# Patient Record
Sex: Male | Born: 1975
Health system: Southern US, Community
[De-identification: ages and names within clinical notes are randomized; demographics above are authoritative.]

## PROBLEM LIST (undated history)

## (undated) DIAGNOSIS — E041 Nontoxic single thyroid nodule: Secondary | ICD-10-CM

## (undated) DIAGNOSIS — G43909 Migraine, unspecified, not intractable, without status migrainosus: Secondary | ICD-10-CM

## (undated) DIAGNOSIS — K219 Gastro-esophageal reflux disease without esophagitis: Secondary | ICD-10-CM

## (undated) HISTORY — DX: Gastro-esophageal reflux disease without esophagitis: K21.9

## (undated) HISTORY — DX: Migraine, unspecified, not intractable, without status migrainosus: G43.909

## (undated) HISTORY — DX: Nontoxic single thyroid nodule: E04.1

## (undated) HISTORY — PX: OTHER SURGICAL HISTORY: SHX169

---

## 2001-03-08 HISTORY — PX: OTHER SURGICAL HISTORY: SHX169

## 2009-03-22 ENCOUNTER — Emergency Department (HOSPITAL_COMMUNITY): Admission: EM | Admit: 2009-03-22 | Discharge: 2009-03-22 | Payer: Self-pay | Admitting: Emergency Medicine

## 2011-03-04 ENCOUNTER — Encounter (INDEPENDENT_AMBULATORY_CARE_PROVIDER_SITE_OTHER): Payer: Self-pay

## 2017-05-10 DIAGNOSIS — Z0000 Encounter for general adult medical examination without abnormal findings: Secondary | ICD-10-CM | POA: Diagnosis not present

## 2017-05-17 DIAGNOSIS — G43909 Migraine, unspecified, not intractable, without status migrainosus: Secondary | ICD-10-CM | POA: Diagnosis not present

## 2017-05-17 DIAGNOSIS — Z0000 Encounter for general adult medical examination without abnormal findings: Secondary | ICD-10-CM | POA: Diagnosis not present

## 2017-05-17 DIAGNOSIS — J069 Acute upper respiratory infection, unspecified: Secondary | ICD-10-CM | POA: Diagnosis not present

## 2017-05-17 DIAGNOSIS — Z1389 Encounter for screening for other disorder: Secondary | ICD-10-CM | POA: Diagnosis not present

## 2017-06-02 DIAGNOSIS — H5213 Myopia, bilateral: Secondary | ICD-10-CM | POA: Diagnosis not present

## 2017-06-02 DIAGNOSIS — D3132 Benign neoplasm of left choroid: Secondary | ICD-10-CM | POA: Diagnosis not present

## 2017-06-02 DIAGNOSIS — H52223 Regular astigmatism, bilateral: Secondary | ICD-10-CM | POA: Diagnosis not present

## 2018-05-16 DIAGNOSIS — R82998 Other abnormal findings in urine: Secondary | ICD-10-CM | POA: Diagnosis not present

## 2018-05-16 DIAGNOSIS — Z0000 Encounter for general adult medical examination without abnormal findings: Secondary | ICD-10-CM | POA: Diagnosis not present

## 2018-05-23 DIAGNOSIS — Z0000 Encounter for general adult medical examination without abnormal findings: Secondary | ICD-10-CM | POA: Diagnosis not present

## 2018-07-18 DIAGNOSIS — Z3009 Encounter for other general counseling and advice on contraception: Secondary | ICD-10-CM | POA: Diagnosis not present

## 2018-07-28 DIAGNOSIS — H04123 Dry eye syndrome of bilateral lacrimal glands: Secondary | ICD-10-CM | POA: Diagnosis not present

## 2018-07-28 DIAGNOSIS — H5213 Myopia, bilateral: Secondary | ICD-10-CM | POA: Diagnosis not present

## 2018-07-28 DIAGNOSIS — D3132 Benign neoplasm of left choroid: Secondary | ICD-10-CM | POA: Diagnosis not present

## 2018-10-26 ENCOUNTER — Other Ambulatory Visit: Payer: Self-pay

## 2018-10-26 DIAGNOSIS — Z20822 Contact with and (suspected) exposure to covid-19: Secondary | ICD-10-CM

## 2018-10-27 LAB — NOVEL CORONAVIRUS, NAA: SARS-CoV-2, NAA: NOT DETECTED

## 2018-12-22 ENCOUNTER — Other Ambulatory Visit: Payer: Self-pay

## 2018-12-22 DIAGNOSIS — Z20822 Contact with and (suspected) exposure to covid-19: Secondary | ICD-10-CM

## 2018-12-24 LAB — NOVEL CORONAVIRUS, NAA: SARS-CoV-2, NAA: NOT DETECTED

## 2019-09-27 ENCOUNTER — Ambulatory Visit
Admission: RE | Admit: 2019-09-27 | Discharge: 2019-09-27 | Disposition: A | Discharge status: Home / Self Care | Payer: Self-pay | Source: Ambulatory Visit | Attending: Internal Medicine | Admitting: Internal Medicine

## 2019-09-27 ENCOUNTER — Other Ambulatory Visit: Payer: Self-pay | Admitting: Internal Medicine

## 2019-09-27 DIAGNOSIS — E079 Disorder of thyroid, unspecified: Secondary | ICD-10-CM

## 2019-09-28 ENCOUNTER — Other Ambulatory Visit: Payer: Self-pay | Admitting: Internal Medicine

## 2019-09-28 DIAGNOSIS — E079 Disorder of thyroid, unspecified: Secondary | ICD-10-CM

## 2019-10-02 ENCOUNTER — Ambulatory Visit
Admission: RE | Admit: 2019-10-02 | Discharge: 2019-10-02 | Disposition: A | Discharge status: Home / Self Care | Payer: 59 | Source: Ambulatory Visit | Attending: Internal Medicine | Admitting: Internal Medicine

## 2019-10-02 ENCOUNTER — Other Ambulatory Visit (HOSPITAL_COMMUNITY)
Admission: RE | Admit: 2019-10-02 | Discharge: 2019-10-02 | Disposition: A | Discharge status: Home / Self Care | Payer: 59 | Source: Ambulatory Visit | Attending: Student | Admitting: Student

## 2019-10-02 DIAGNOSIS — E041 Nontoxic single thyroid nodule: Secondary | ICD-10-CM | POA: Diagnosis present

## 2019-10-02 DIAGNOSIS — E079 Disorder of thyroid, unspecified: Secondary | ICD-10-CM

## 2019-10-03 LAB — CYTOLOGY - NON PAP

## 2019-10-09 ENCOUNTER — Other Ambulatory Visit: Payer: 59

## 2020-02-11 ENCOUNTER — Other Ambulatory Visit (HOSPITAL_COMMUNITY): Payer: Self-pay | Admitting: Internal Medicine

## 2020-02-11 DIAGNOSIS — R002 Palpitations: Secondary | ICD-10-CM

## 2020-02-14 ENCOUNTER — Other Ambulatory Visit: Payer: Self-pay

## 2020-02-14 ENCOUNTER — Ambulatory Visit (HOSPITAL_COMMUNITY)
Admission: RE | Admit: 2020-02-14 | Discharge: 2020-02-14 | Disposition: A | Discharge status: Home / Self Care | Payer: 59 | Source: Ambulatory Visit | Attending: Internal Medicine | Admitting: Internal Medicine

## 2020-02-14 DIAGNOSIS — R002 Palpitations: Secondary | ICD-10-CM | POA: Diagnosis present

## 2020-02-14 LAB — ECHOCARDIOGRAM COMPLETE
Area-P 1/2: 3.6 cm2
S' Lateral: 3.1 cm

## 2020-02-14 NOTE — Progress Notes (Signed)
  Echocardiogram 2D Echocardiogram has been performed.  Eric Chaney M 02/14/2020, 2:26 PM

## 2020-03-06 ENCOUNTER — Other Ambulatory Visit (HOSPITAL_COMMUNITY): Payer: 59

## 2020-03-10 ENCOUNTER — Telehealth (HOSPITAL_COMMUNITY): Payer: Self-pay | Admitting: Cardiology

## 2020-03-10 DIAGNOSIS — R002 Palpitations: Secondary | ICD-10-CM

## 2020-03-10 NOTE — Telephone Encounter (Signed)
Per Dr Gala Romney Patient in need of stress test (tredmill only) or preferably CPX and 14 day zio monitor for palps. Patients availability is Friday afternoons.   1/7 @ 11am ok with patient

## 2020-03-14 ENCOUNTER — Encounter (HOSPITAL_COMMUNITY): Payer: 59

## 2020-03-20 ENCOUNTER — Other Ambulatory Visit (HOSPITAL_COMMUNITY): Payer: Self-pay | Admitting: *Deleted

## 2020-03-20 ENCOUNTER — Ambulatory Visit (HOSPITAL_COMMUNITY)
Admission: RE | Admit: 2020-03-20 | Discharge: 2020-03-20 | Disposition: A | Discharge status: Home / Self Care | Payer: 59 | Source: Ambulatory Visit | Attending: Internal Medicine | Admitting: Internal Medicine

## 2020-03-20 ENCOUNTER — Ambulatory Visit (HOSPITAL_COMMUNITY): Payer: 59

## 2020-03-20 ENCOUNTER — Other Ambulatory Visit (HOSPITAL_COMMUNITY): Payer: Self-pay | Admitting: Internal Medicine

## 2020-03-20 ENCOUNTER — Other Ambulatory Visit: Payer: Self-pay

## 2020-03-20 DIAGNOSIS — R002 Palpitations: Secondary | ICD-10-CM

## 2020-07-08 ENCOUNTER — Other Ambulatory Visit (HOSPITAL_COMMUNITY): Payer: Self-pay | Admitting: *Deleted

## 2020-07-10 ENCOUNTER — Ambulatory Visit (HOSPITAL_BASED_OUTPATIENT_CLINIC_OR_DEPARTMENT_OTHER)
Admission: RE | Admit: 2020-07-10 | Discharge: 2020-07-10 | Disposition: A | Discharge status: Home / Self Care | Payer: 59 | Source: Ambulatory Visit | Attending: Cardiology | Admitting: Cardiology

## 2020-07-10 ENCOUNTER — Other Ambulatory Visit: Payer: Self-pay

## 2020-08-21 ENCOUNTER — Other Ambulatory Visit: Payer: Self-pay | Admitting: Surgery

## 2020-08-21 DIAGNOSIS — E041 Nontoxic single thyroid nodule: Secondary | ICD-10-CM

## 2020-08-28 ENCOUNTER — Ambulatory Visit
Admission: RE | Admit: 2020-08-28 | Discharge: 2020-08-28 | Disposition: A | Discharge status: Home / Self Care | Payer: 59 | Source: Ambulatory Visit | Attending: Surgery | Admitting: Surgery

## 2020-08-28 DIAGNOSIS — E041 Nontoxic single thyroid nodule: Secondary | ICD-10-CM

## 2020-09-09 ENCOUNTER — Other Ambulatory Visit: Payer: Self-pay | Admitting: Surgery

## 2020-09-09 DIAGNOSIS — E041 Nontoxic single thyroid nodule: Secondary | ICD-10-CM

## 2020-09-16 DIAGNOSIS — E041 Nontoxic single thyroid nodule: Secondary | ICD-10-CM | POA: Diagnosis not present

## 2020-09-17 ENCOUNTER — Ambulatory Visit
Admission: RE | Admit: 2020-09-17 | Discharge: 2020-09-17 | Disposition: A | Discharge status: Home / Self Care | Payer: 59 | Source: Ambulatory Visit | Attending: Surgery | Admitting: Surgery

## 2020-09-17 ENCOUNTER — Other Ambulatory Visit: Payer: Self-pay | Admitting: Surgery

## 2020-09-17 ENCOUNTER — Other Ambulatory Visit: Payer: Self-pay

## 2020-09-17 DIAGNOSIS — E041 Nontoxic single thyroid nodule: Secondary | ICD-10-CM

## 2020-10-08 DIAGNOSIS — R002 Palpitations: Secondary | ICD-10-CM | POA: Diagnosis not present

## 2020-11-06 DIAGNOSIS — E041 Nontoxic single thyroid nodule: Secondary | ICD-10-CM | POA: Diagnosis not present

## 2020-12-18 ENCOUNTER — Encounter: Payer: Self-pay | Admitting: Cardiology

## 2020-12-18 ENCOUNTER — Ambulatory Visit: Payer: BC Managed Care – PPO | Admitting: Cardiology

## 2020-12-18 ENCOUNTER — Other Ambulatory Visit: Payer: Self-pay

## 2020-12-18 VITALS — BP 118/70 | HR 74 | Ht 69.0 in | Wt 172.0 lb

## 2020-12-18 DIAGNOSIS — I493 Ventricular premature depolarization: Secondary | ICD-10-CM | POA: Diagnosis not present

## 2020-12-18 NOTE — Progress Notes (Signed)
Electrophysiology Office Note   Date:  12/18/2020   ID:  Eric Chaney, DOB 1976/01/10, MRN [id number]  PCP:  Eric Organ., MD  Cardiologist:   Primary Electrophysiologist:  Eric Bade Meredith Leeds, MD    Chief Complaint: palpitations   History of Present Illness: Eric Chaney is a 45 y.o. male who is being seen today for the evaluation of palpitations at the request of Eric Organ., MD. Presenting today for electrophysiology evaluation.  He presents today for work-up of palpitations.  He has a history of PVCs.  He was able to capture them on his cardia mobile which, per his primary physician, were occurring every 3-4 beats.   Today, he denies symptoms of palpitations, chest pain, shortness of breath, orthopnea, PND, lower extremity edema, claudication, dizziness, presyncope, syncope, bleeding, or neurologic sequela. The patient is tolerating medications without difficulties.  He has intermittent palpitations.  His palpitations occur for a few months at a time and then go away for quite a while.  His last episode of palpitations was over the summer.  He went to his primary physician a month into his palpitations and was referred here.  A few weeks after that, his palpitations went away.  He currently feels well and is without complaint.   Past Medical History:  Diagnosis Date   Migraine    Thyroid nodule    Past Surgical History:  Procedure Laterality Date   no surgical history       Current Outpatient Medications  Medication Sig Dispense Refill   topiramate (TOPAMAX) 50 MG tablet TAKE 1 TABLET BY MOUTH AT BEDTIME 90     No current facility-administered medications for this visit.    Allergies:   Patient has no known allergies.   Social History:  The patient  reports that he has never smoked. He has never used smokeless tobacco. He reports current alcohol use. He reports that he does not use drugs.   Family History:  The patient's family history includes  Hypertension in his brother and mother.    ROS:  Please see the history of present illness.   Otherwise, review of systems is positive for none.   All other systems are reviewed and negative.    PHYSICAL EXAM: VS:  BP 118/70   Pulse 74   Ht 5\' 9"  (1.753 m)   Wt 172 lb (78 kg)   SpO2 98%   BMI 25.40 kg/m  , BMI Body mass index is 25.4 kg/m. GEN: Well nourished, well developed, in no acute distress  HEENT: normal  Neck: no JVD, carotid bruits, or masses Cardiac: RRR; no murmurs, rubs, or gallops,no edema  Respiratory:  clear to auscultation bilaterally, normal work of breathing GI: soft, nontender, nondistended, + BS MS: no deformity or atrophy  Skin: warm and dry Neuro:  Strength and sensation are intact Psych: euthymic mood, full affect  EKG:  EKG is ordered today. Personal review of the ekg ordered shows sinus rhythm  Recent Labs: No results found for requested labs within last 8760 hours.    Lipid Panel  No results found for: CHOL, TRIG, HDL, CHOLHDL, VLDL, LDLCALC, LDLDIRECT   Wt Readings from Last 3 Encounters:  12/18/20 172 lb (78 kg)      Other studies Reviewed: Additional studies/ records that were reviewed today include: TTE 02/14/20  Review of the above records today demonstrates:   1. Left ventricular ejection fraction, by estimation, is 65 to 70%. The  left ventricle has normal function.  The left ventricle has no regional  wall motion abnormalities. Left ventricular diastolic parameters were  normal.   2. Right ventricular systolic function is normal. The right ventricular  size is normal.   3. The mitral valve is normal in structure. Trivial mitral valve  regurgitation.   4. The aortic valve is normal in structure. Aortic valve regurgitation is  not visualized.   1. Sinus rhythm - Min HR of 48 bpm, max HR of 168 bpm, and avg HR of 79 bpm 2. One run of monomorphic NSVT occurred at 4:49a lasting 4 beats with a max rate of 148 bpm (avg 136 bpm). 3.  Rare PACs and PVCs (<1.0%),   ASSESSMENT AND PLAN:  1.  PVCs: Patient wore a cardiac monitor that showed a low burden of PVCs.  He says PVCs are intermittent.  There are times that he has them quite often, these episodes last up to a few months at a time.  He feels quite poorly when he has these episodes.  He is not feeling PVCs currently.  We [name] continue to monitor.  If he has PVCs in the future, [name] potentially start flecainide for a short-term.  Case discussed with primary physician  Current medicines are reviewed at length with the patient today.   The patient does not have concerns regarding his medicines.  The following changes were made today:  none  Labs/ tests ordered today include:  Orders Placed This Encounter  Procedures   EKG 12-Lead      Disposition:   FU with [name] as needed  Signed, Garison Genova Meredith Leeds, MD  12/18/2020 3:38 PM     [facility] [address] [place] [address] [phone] (office) [phone] (fax)

## 2021-02-10 IMAGING — US US THYROID
1 series · 13 of 25 positions shown · non-contrast
Comparison: None.

CLINICAL DATA: Thyroid mass on physical examination.

EXAM:
THYROID ULTRASOUND
TECHNIQUE: Ultrasound examination of the thyroid gland and adjacent soft
tissues was performed.

[Series 1: us thyroid · 0.05mm/px · 13 of 59 slices shown]
[im 1/59]
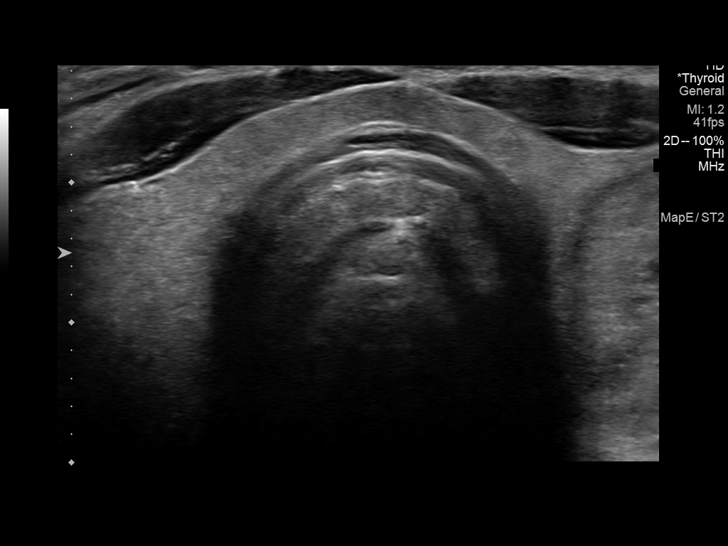
[im 5/59]
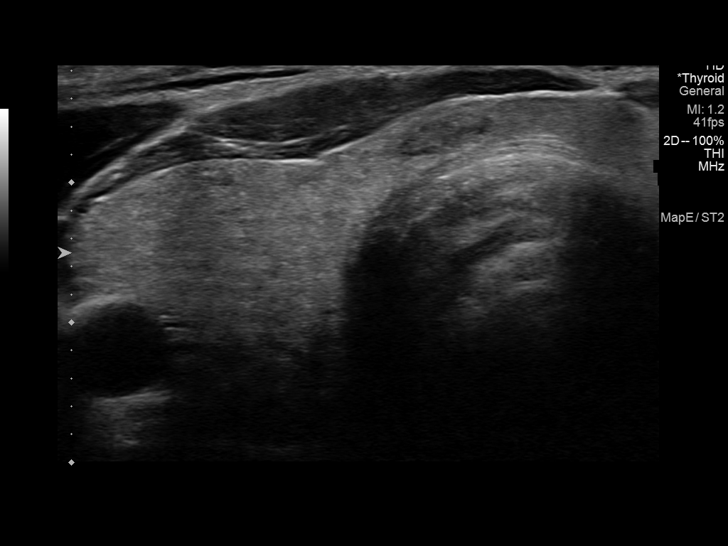
[im 10/59]
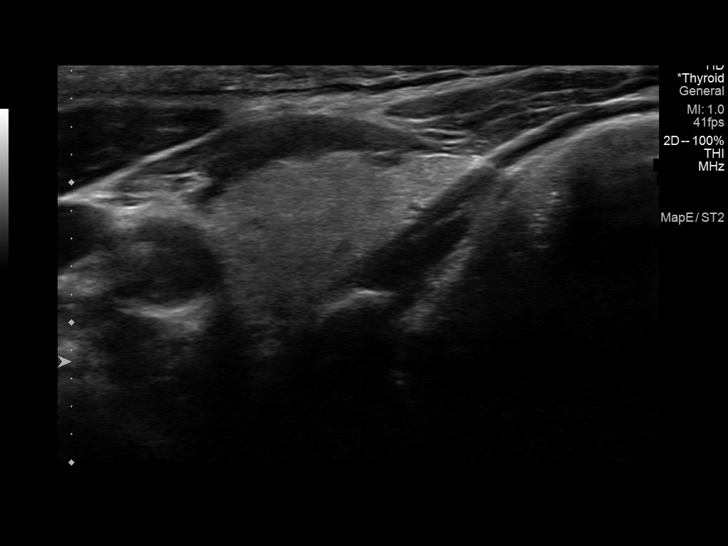
[im 15/59]
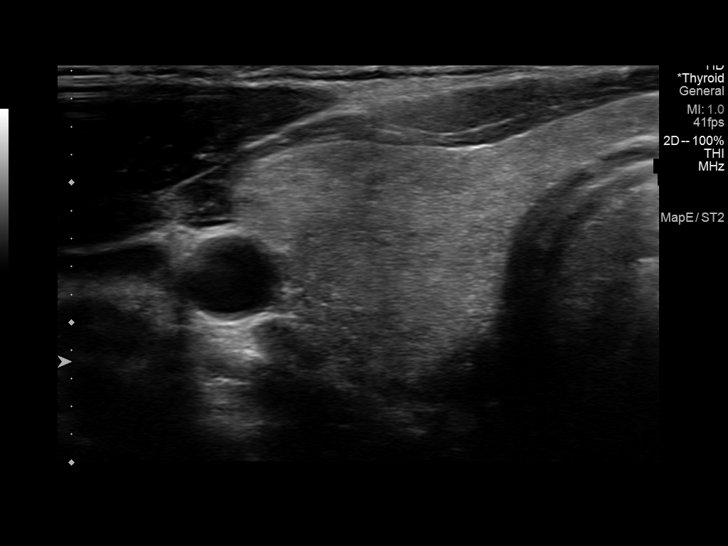
[im 20/59]
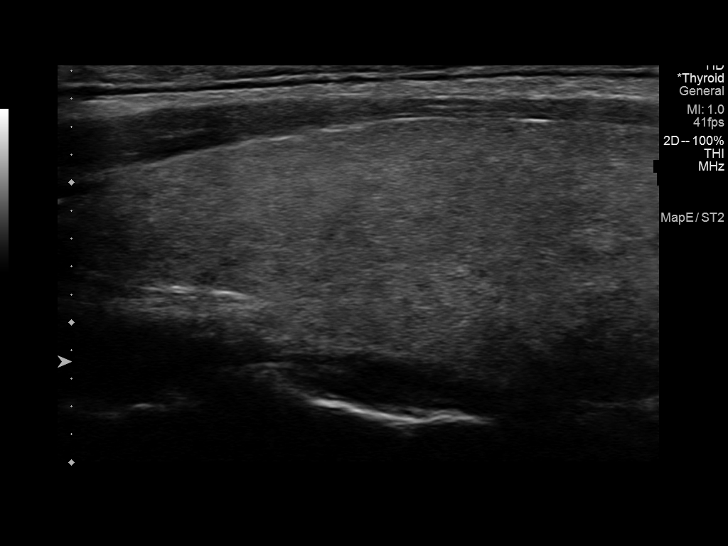
[im 25/59]
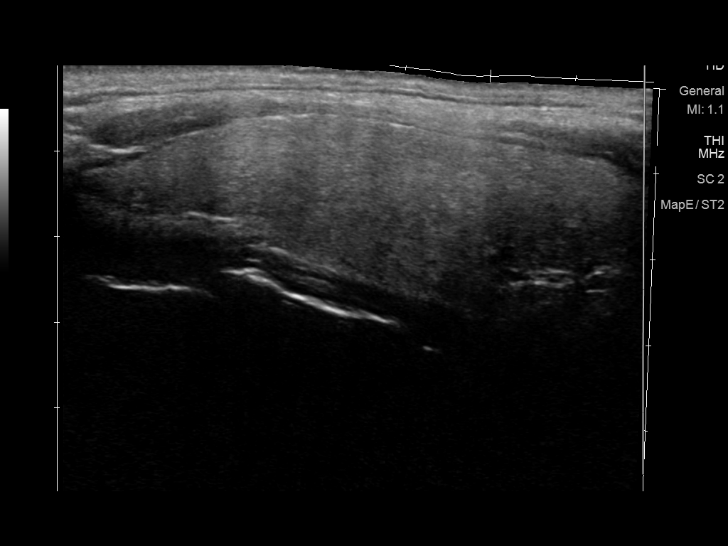
[im 30/59]
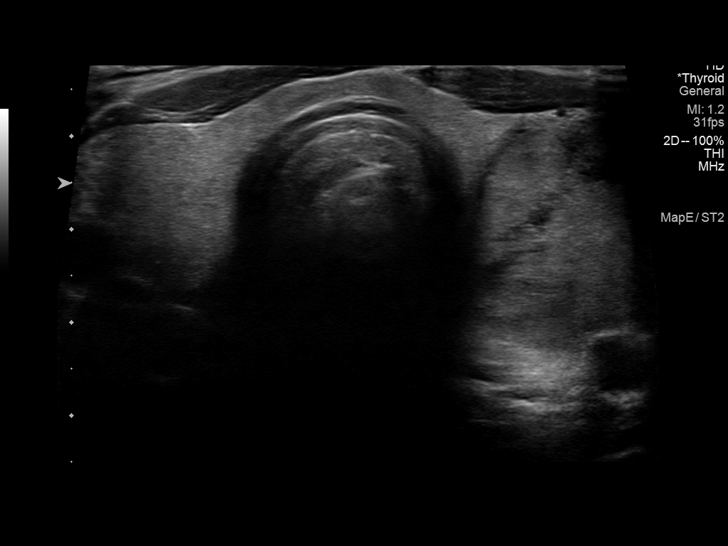
[im 34/59]
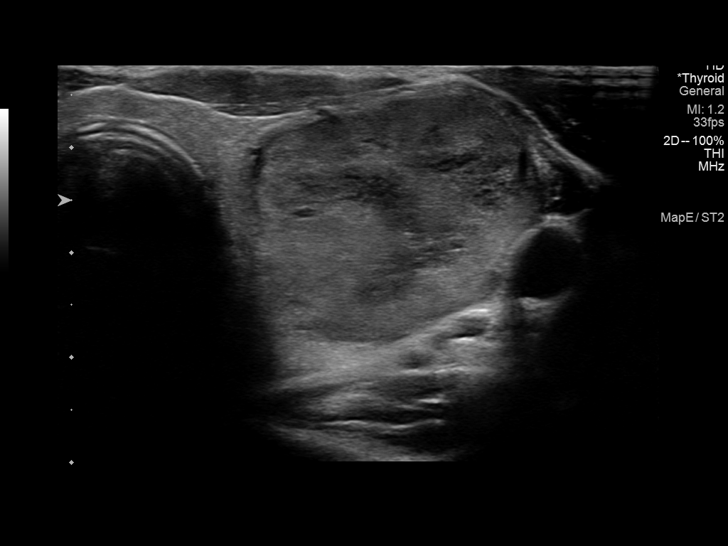
[im 39/59]
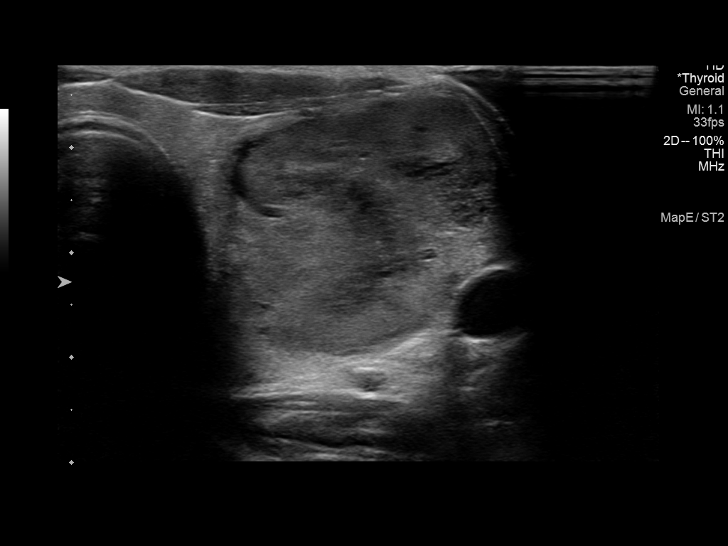
[im 44/59]
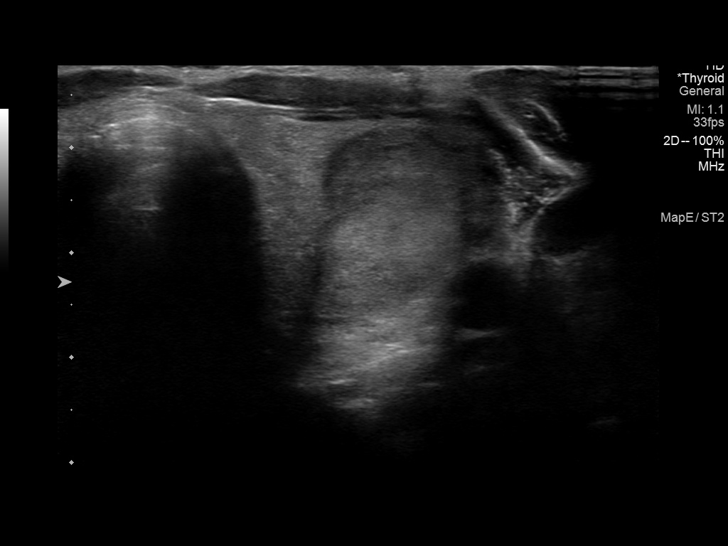
[im 49/59]
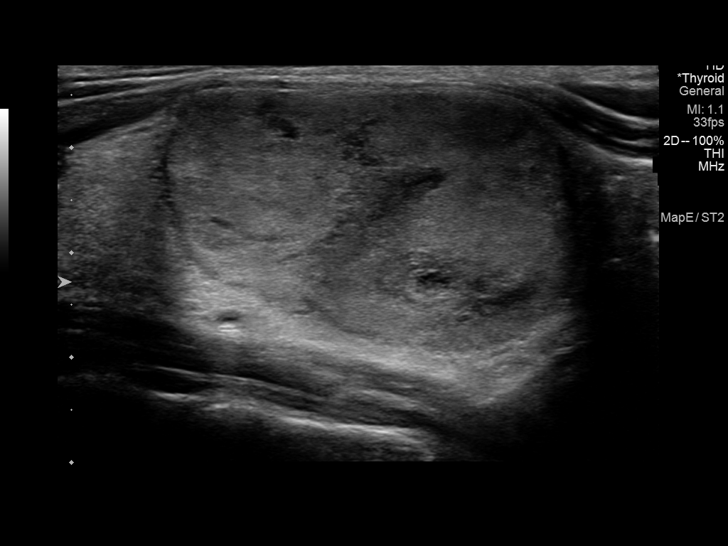
[im 54/59]
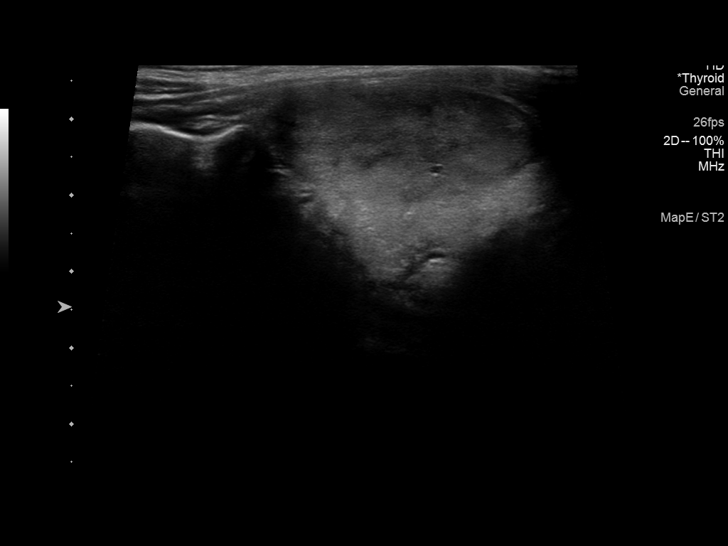
[im 59/59]
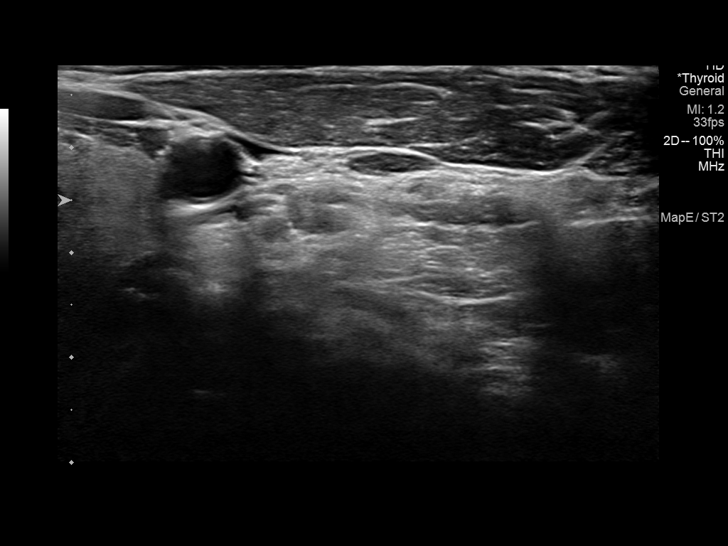

[13 of 25 positions shown; findings below may reference images not displayed]

FINDINGS: Parenchymal Echotexture: Mildly heterogenous

Isthmus: 0.3 cm

Right lobe: 6.2 x 1.9 x 2.1 cm

Left lobe: 6.1 x 2.9 x 3.1 cm

_________________________________________________________

Estimated total number of nodules >/= 1 cm: 1

Number of spongiform nodules >/=  2 cm not described below (TR1): 0

Number of mixed cystic and solid nodules >/= 1.5 cm not described
below (TR2): 0

_________________________________________________________

Nodule 1 is a hypoechoic nodule along the right side of the isthmus
that measures 0.7 x 0.3 x 0.7 cm.

No discrete right thyroid lobe nodule.

Nodule # 2:

Location: Left; Mid

Maximum size: 4.0 cm; Other 2 dimensions: 2.3 x 2.6 cm

Composition: solid/almost completely solid (2)

Echogenicity: hypoechoic (2)

Shape: not taller-than-wide (0)

Margins: smooth (0)

Echogenic foci: none (0)

ACR TI-RADS total points: 4.

ACR TI-RADS risk category: TR4 (4-6 points).

ACR TI-RADS recommendations:

**Given size (>/= 1.5 cm) and appearance, fine needle aspiration of
this moderately suspicious nodule should be considered based on
TI-RADS criteria.

_________________________________________________________
IMPRESSION: Left thyroid nodule that measures up to 4.0 cm. This is a TR 4
nodule that meets criteria for ultrasound-guided biopsy.

The above is in keeping with the ACR TI-RADS recommendations - [HOSPITAL] 8855;[DATE].

## 2021-02-15 IMAGING — US US FNA BIOPSY THYROID 1ST LESION
1 series · 13 of 17 positions shown · non-contrast
Comparison: US thyroid [id number]

MEDICATIONS:
1% lidocaine, 5 ml

COMPLICATIONS:
None immediate.

INDICATION: Indeterminate thyroid nodule; left mid thyroid

EXAM:
ULTRASOUND GUIDED FINE NEEDLE ASPIRATION OF INDETERMINATE THYROID
NODULE
TECHNIQUE: Informed written consent was obtained from the patient after a
discussion of the risks, benefits and alternatives to treatment.
Questions regarding the procedure were encouraged and answered. A
timeout was performed prior to the initiation of the procedure.

[Series 1: us fna biopsy thyroid 1st lesion · 0.06mm/px · 17 acquisitions, 13 frames shown]
[im 1/17]
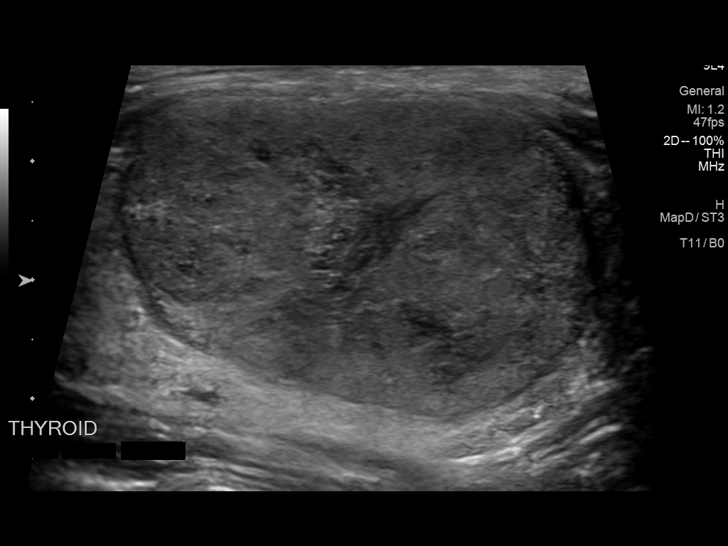
[im 2/17]
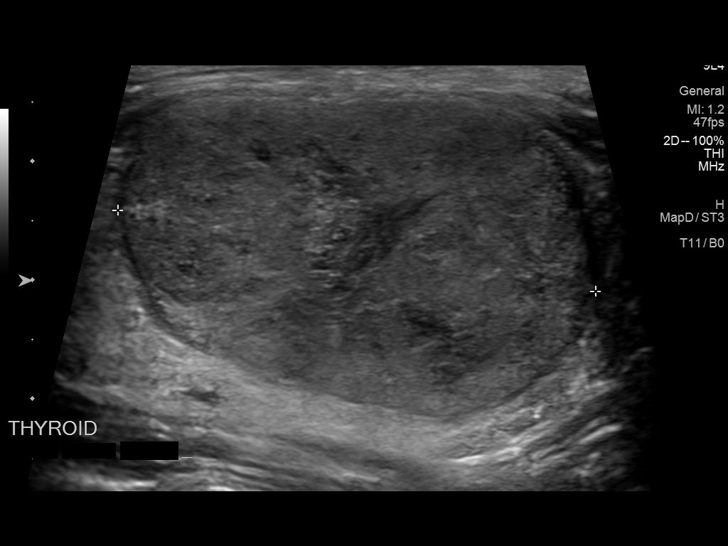
[im 4/17]
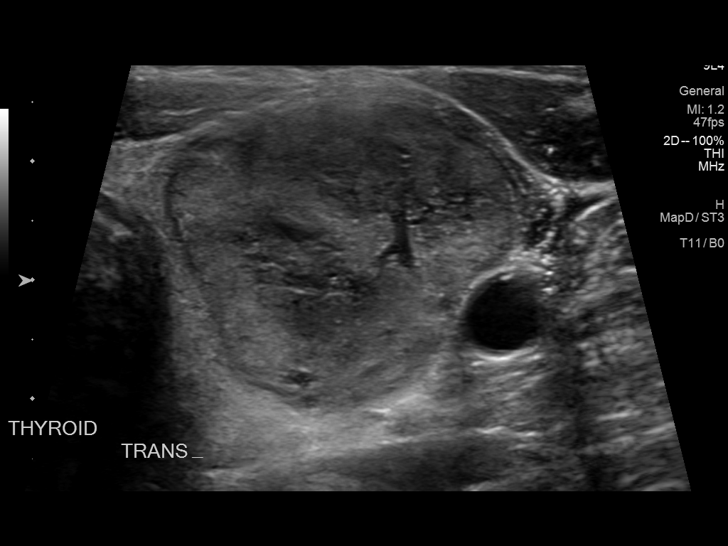
[im 5/17]
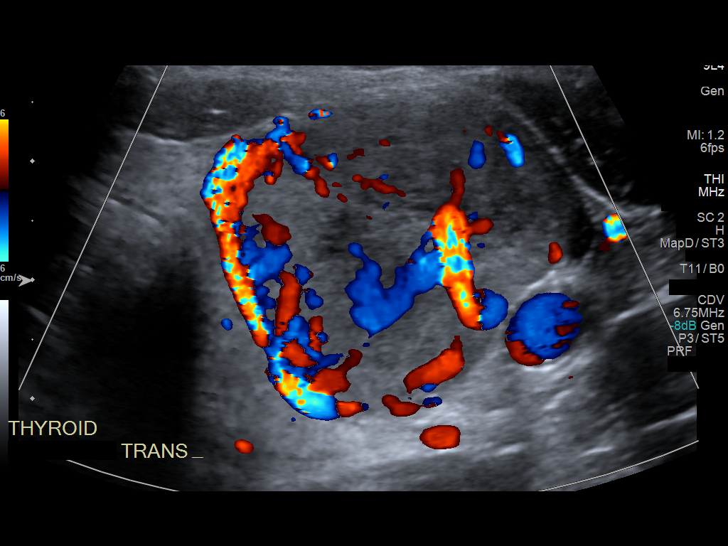
[im 6/17]
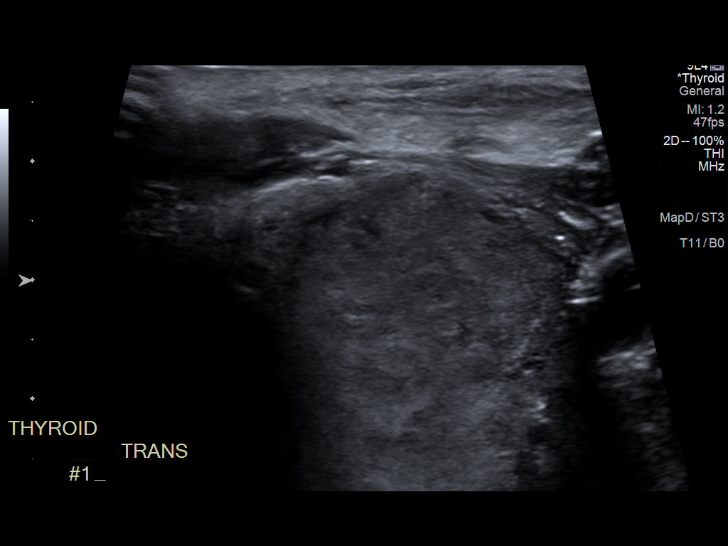
[im 8/17]
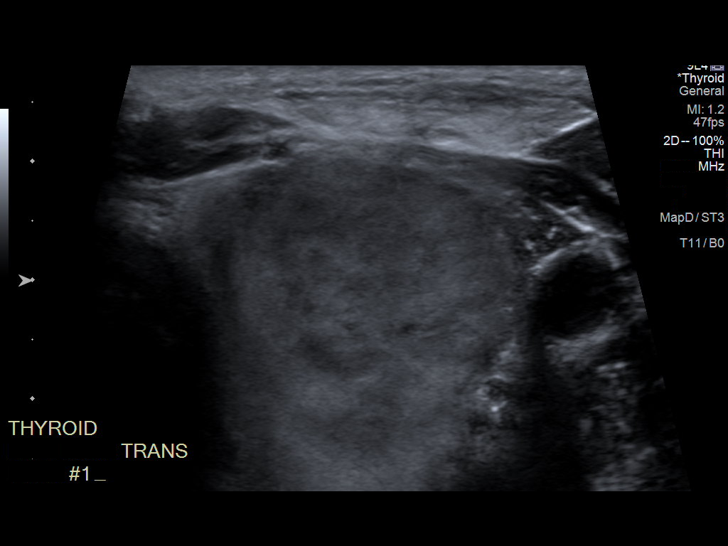
[im 9/17]
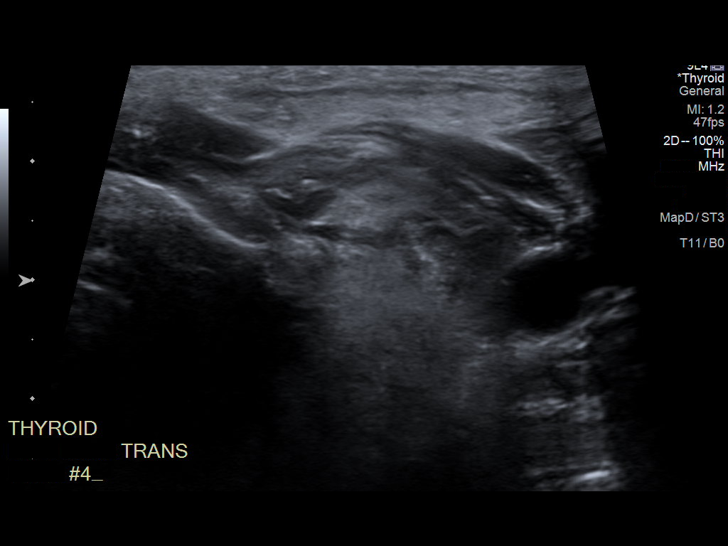
[im 10/17]
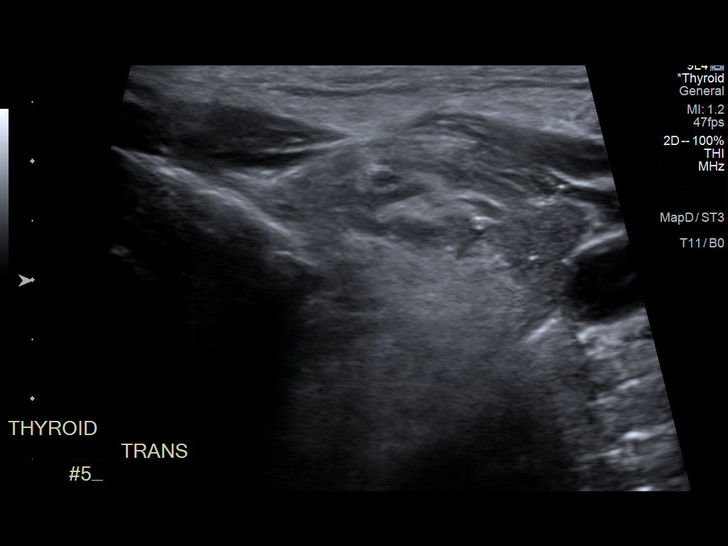
[im 12/17]
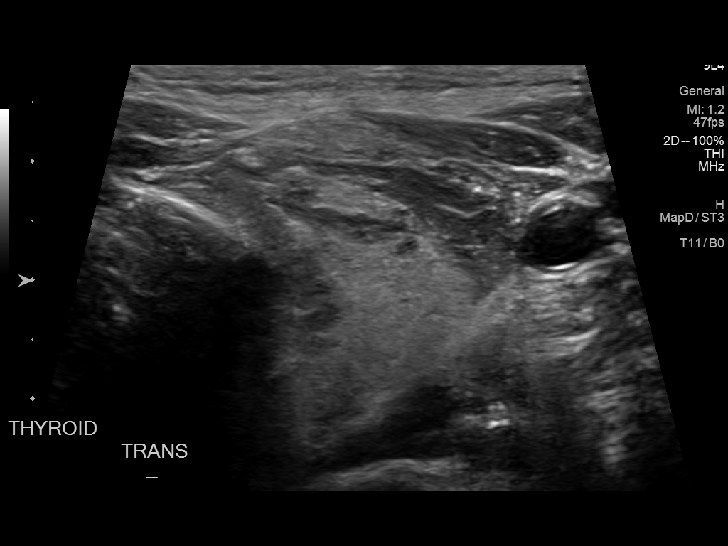
[im 13/17]
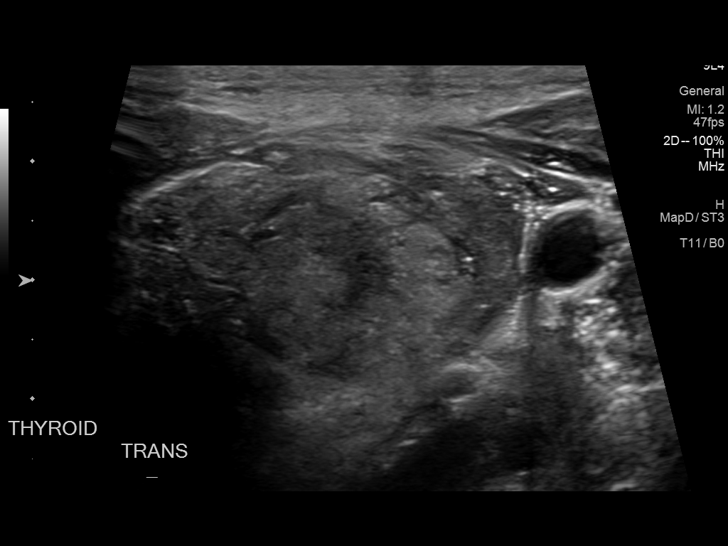
[im 14/17]
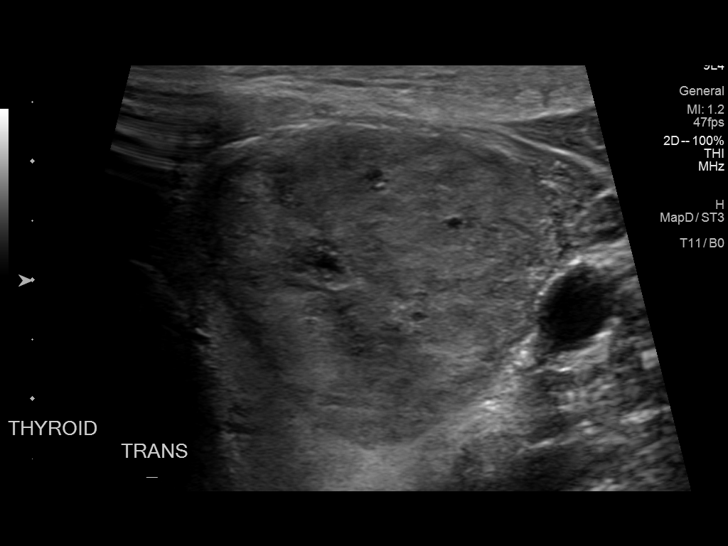
[im 16/17]
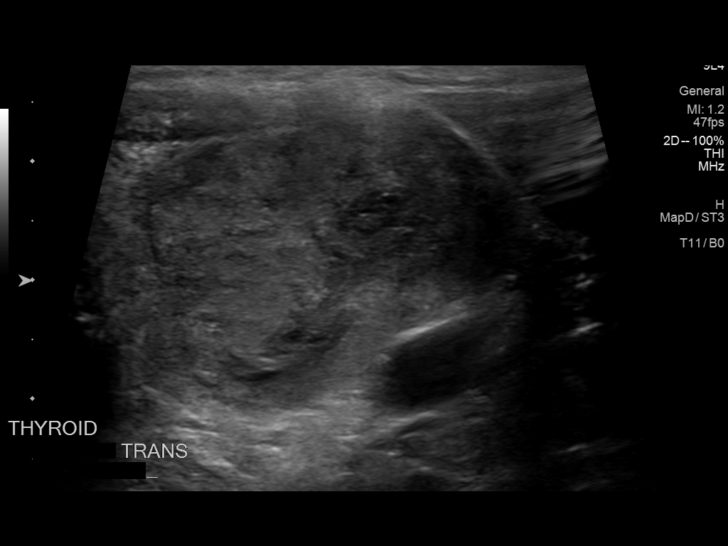
[im 17/17]
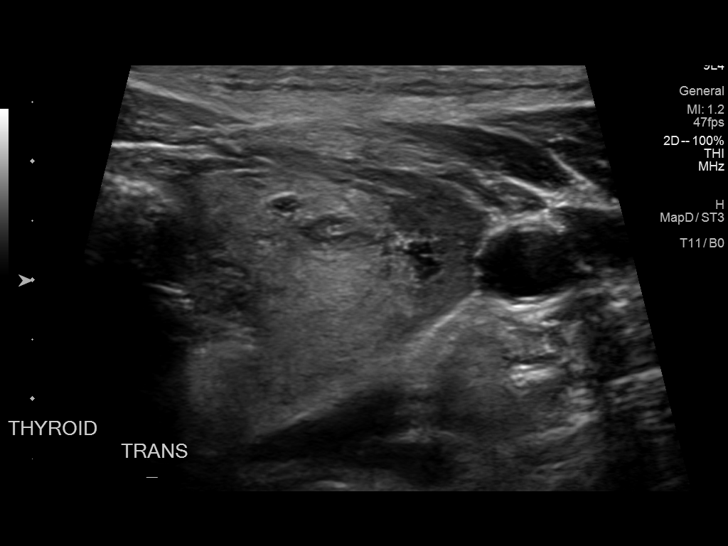

[13 of 17 positions shown; findings below may reference images not displayed]

Pre-procedural ultrasound scanning demonstrated unchanged size and
appearance of the indeterminate nodule within the left mid thyroid

The procedure was planned. The neck was prepped in the usual sterile
fashion, and a sterile drape was applied covering the operative
field. A timeout was performed prior to the initiation of the
procedure. Local anesthesia was provided with 1% lidocaine.

Under direct ultrasound guidance, 5 FNA biopsies were performed of
the left mid nodule with a 25 gauge needle. Multiple ultrasound
images were saved for procedural documentation purposes. The samples
were prepared and submitted to pathology. 2 of the samples were
prepared for Afirma testing

Limited post procedural scanning was negative for hematoma or
additional complication. Dressings were placed. The patient
tolerated the above procedures procedure well without immediate
postprocedural complication.
FINDINGS: Nodule reference number based on prior diagnostic ultrasound: 2

Maximum size: 4 cm

Location: Left; Mid

ACR TI-RADS risk category: TR4 (4-6 points)

Reason for biopsy: meets ACR TI-RADS criteria

Ultrasound imaging confirms appropriate placement of the needles
within the thyroid nodule.
IMPRESSION: Technically successful ultrasound guided fine needle aspiration of
left mid thyroid nodule. Read by: Jaylon Aujla, NP

## 2021-06-03 DIAGNOSIS — E041 Nontoxic single thyroid nodule: Secondary | ICD-10-CM | POA: Diagnosis not present

## 2021-06-03 DIAGNOSIS — R7301 Impaired fasting glucose: Secondary | ICD-10-CM | POA: Diagnosis not present

## 2021-06-03 DIAGNOSIS — Z125 Encounter for screening for malignant neoplasm of prostate: Secondary | ICD-10-CM | POA: Diagnosis not present

## 2021-06-03 DIAGNOSIS — Z0000 Encounter for general adult medical examination without abnormal findings: Secondary | ICD-10-CM | POA: Diagnosis not present

## 2021-06-10 DIAGNOSIS — R82998 Other abnormal findings in urine: Secondary | ICD-10-CM | POA: Diagnosis not present

## 2021-06-10 DIAGNOSIS — Z1331 Encounter for screening for depression: Secondary | ICD-10-CM | POA: Diagnosis not present

## 2021-06-10 DIAGNOSIS — Z1339 Encounter for screening examination for other mental health and behavioral disorders: Secondary | ICD-10-CM | POA: Diagnosis not present

## 2021-06-10 DIAGNOSIS — Z0000 Encounter for general adult medical examination without abnormal findings: Secondary | ICD-10-CM | POA: Diagnosis not present

## 2021-06-10 DIAGNOSIS — R7301 Impaired fasting glucose: Secondary | ICD-10-CM | POA: Diagnosis not present

## 2021-06-24 DIAGNOSIS — R109 Unspecified abdominal pain: Secondary | ICD-10-CM | POA: Diagnosis not present

## 2021-12-28 ENCOUNTER — Other Ambulatory Visit: Payer: Self-pay | Admitting: Surgery

## 2021-12-28 DIAGNOSIS — E041 Nontoxic single thyroid nodule: Secondary | ICD-10-CM

## 2021-12-31 ENCOUNTER — Ambulatory Visit
Admission: RE | Admit: 2021-12-31 | Discharge: 2021-12-31 | Disposition: A | Discharge status: Home / Self Care | Payer: BC Managed Care – PPO | Source: Ambulatory Visit | Attending: Surgery | Admitting: Surgery

## 2021-12-31 DIAGNOSIS — E042 Nontoxic multinodular goiter: Secondary | ICD-10-CM | POA: Diagnosis not present

## 2021-12-31 DIAGNOSIS — E041 Nontoxic single thyroid nodule: Secondary | ICD-10-CM

## 2022-01-05 NOTE — Progress Notes (Signed)
USN results reviewed.  Agree with radiologist that a contrast enhanced CT scan to better evaluate the nodules, especially the one on the left, is indicated.  Claiborne Billings - please schedule patient for CT scan of neck, with and without constrast, to evaluate thyroid nodules.  [place], MD [facility] [place] practice Office: [phone]

## 2022-01-12 IMAGING — US US THYROID
1 series · 13 of 25 positions shown · non-contrast
Comparison: 09/27/2019

CLINICAL DATA: Follow-up thyroid nodule. Left mid thyroid nodule
fine-needle aspiration was consistent with a benign follicular
nodule on 10/02/2019.

EXAM:
THYROID ULTRASOUND
TECHNIQUE: Ultrasound examination of the thyroid gland and adjacent soft
tissues was performed.

[Series 1: us thyroid · 0.08mm/px · 13 of 87 slices shown]
[im 1/87]
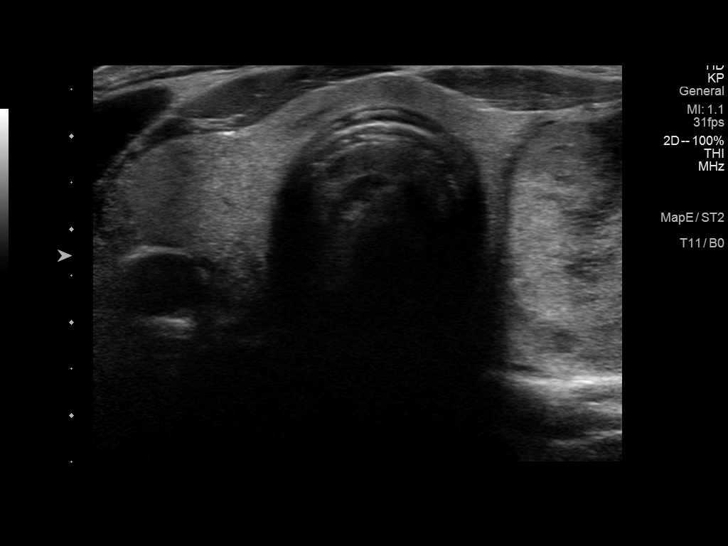
[im 8/87]
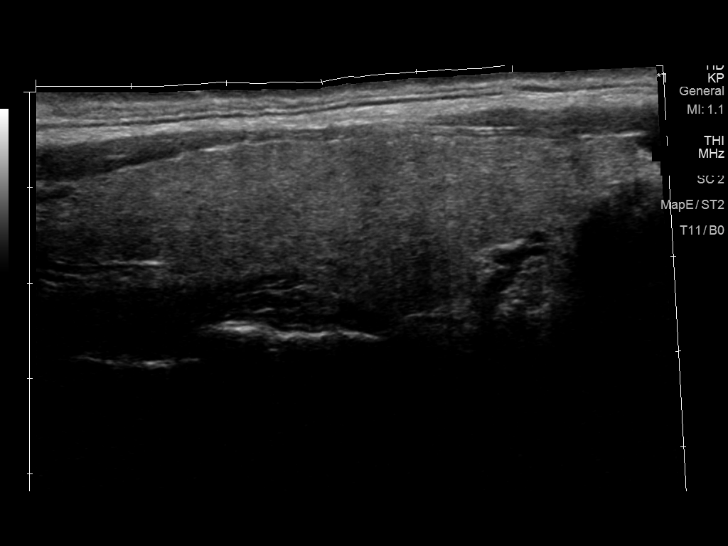
[im 15/87]
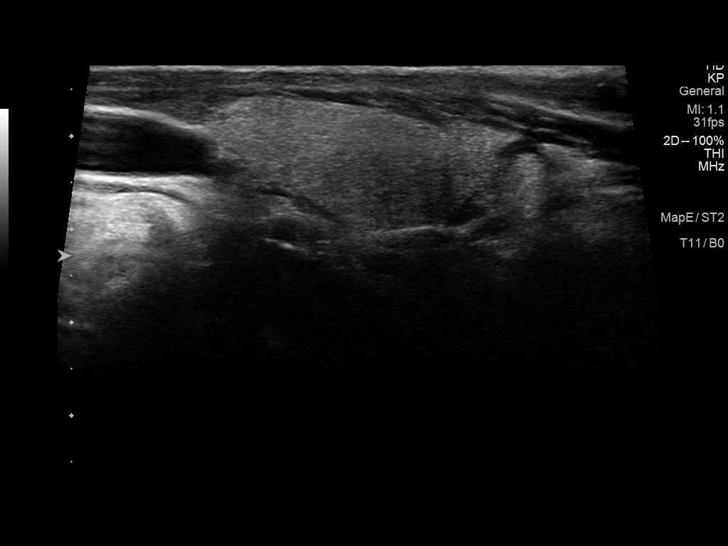
[im 22/87]
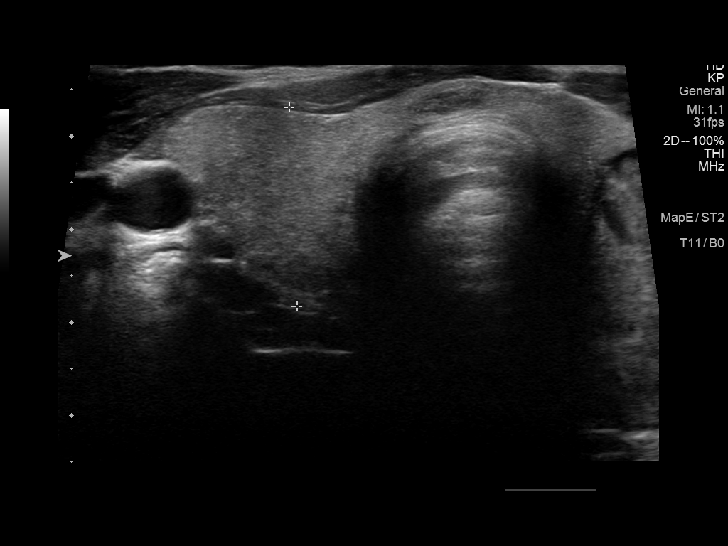
[im 29/87]
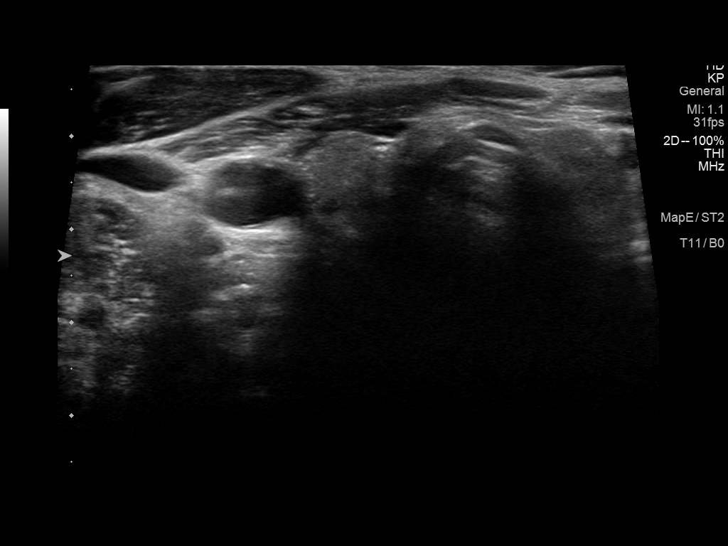
[im 36/87]
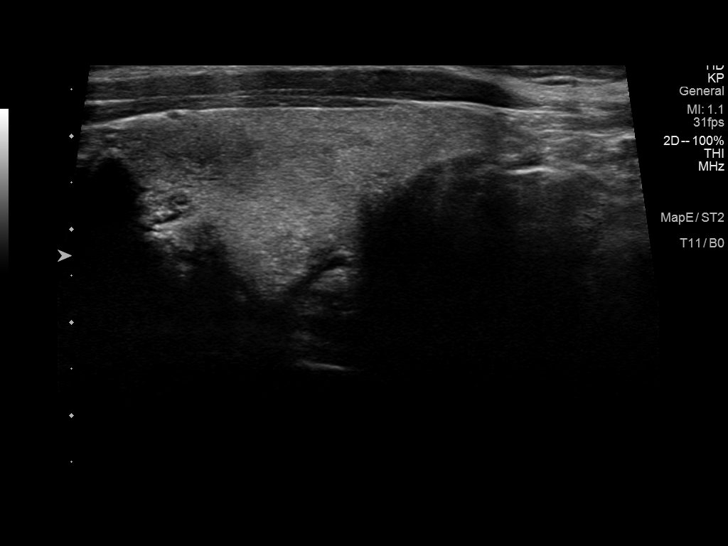
[im 44/87]
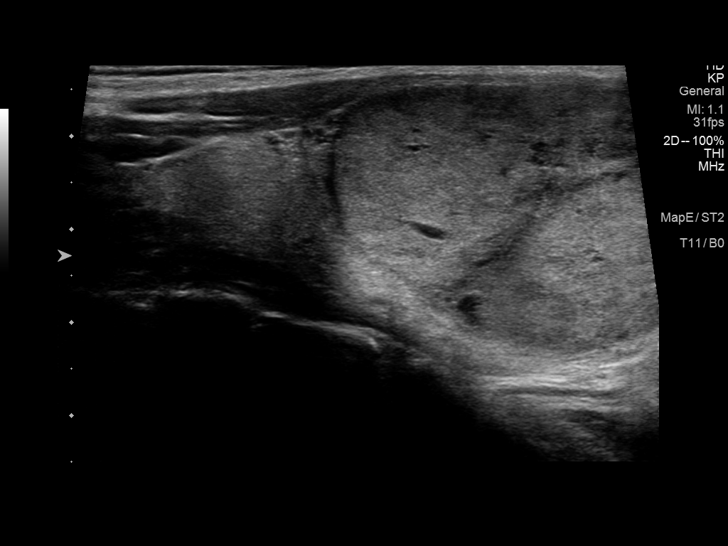
[im 51/87]
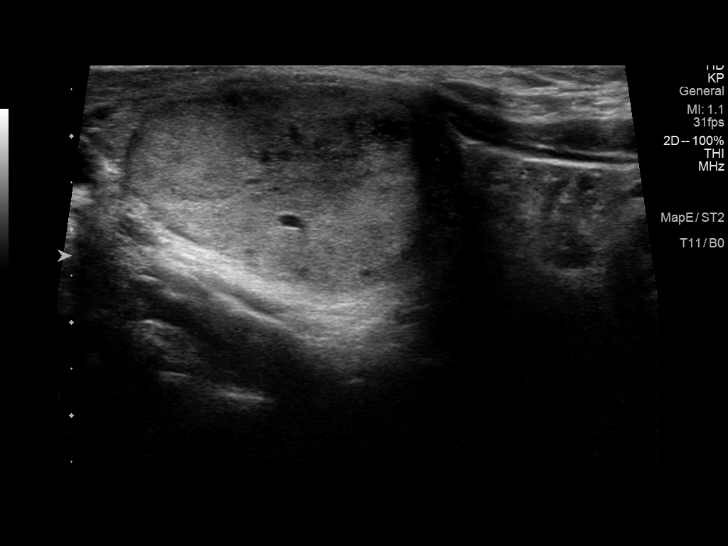
[im 58/87]
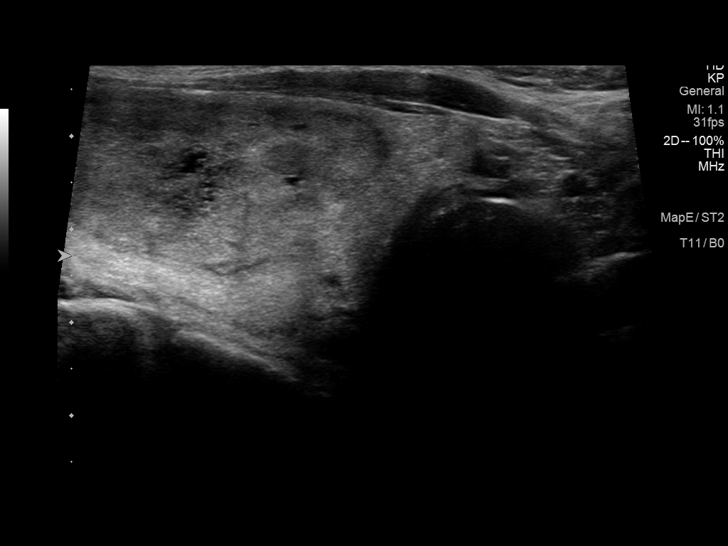
[im 65/87]
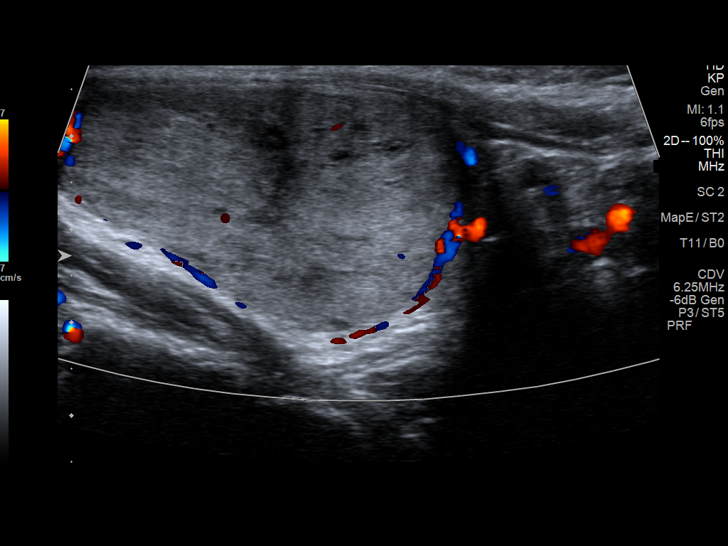
[im 72/87]
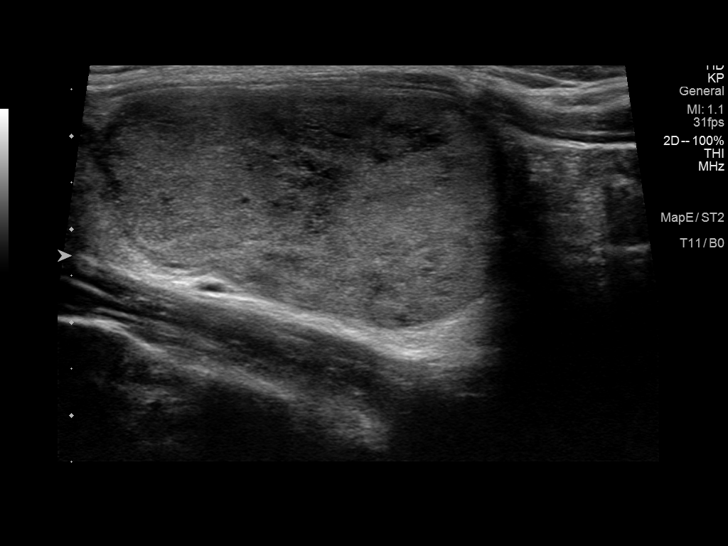
[im 79/87]
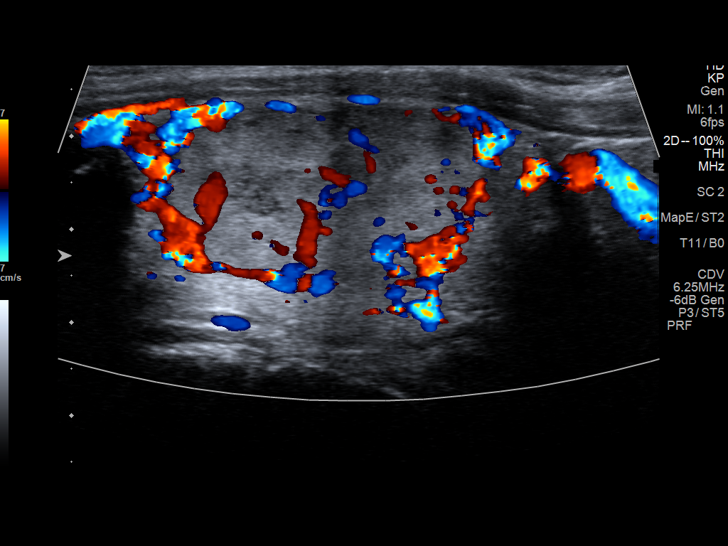
[im 87/87]
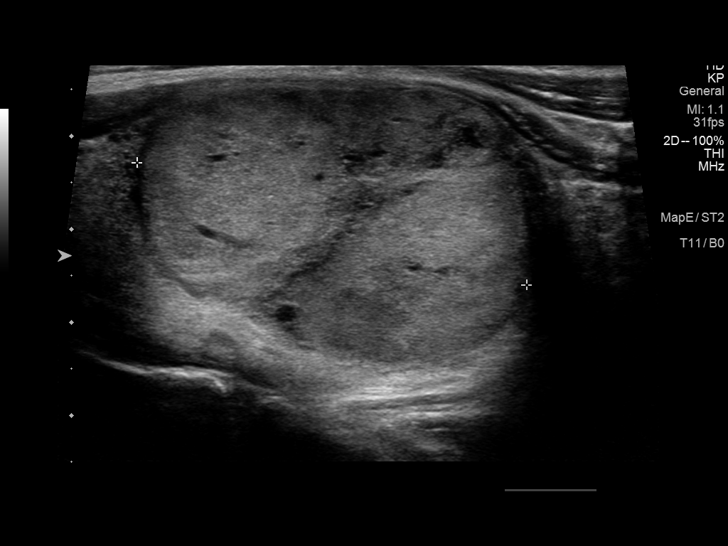

[13 of 25 positions shown; findings below may reference images not displayed]

FINDINGS: Parenchymal Echotexture: Mildly heterogenous

Isthmus: 0.3 cm, previously 0.3 cm

Right lobe: 6.3 x 2.1 x 2.2 cm, previously 6.2 x 1.9 x 2.1 cm

Left lobe: 7.1 x 3.0 x 3.7 cm, previously 6.1 x 2.9 x 3.1 cm

_________________________________________________________

Estimated total number of nodules >/= 1 cm: 2

Number of spongiform nodules >/=  2 cm not described below (TR1): 0

Number of mixed cystic and solid nodules >/= 1.5 cm not described
below (TR2): 0

_________________________________________________________

No discrete right thyroid nodules.

Nodule 1 is a solid isoechoic nodule in the left mid thyroid lobe.
This represents the previously biopsied nodule. Nodule 1 measures
4.4 x 2.8 x 3.1 cm and previously measured 4.0 x 2.3 x 2.6 cm.
Morphology of the nodule has not significantly changed.

Nodule # 2:

Location: Left; Inferior

Maximum size: 1.8 cm; Other 2 dimensions: 0.8 x 1.3 cm

Composition: solid/almost completely solid (2)

Echogenicity: hypoechoic (2)

Shape: not taller-than-wide (0)

Margins: cannot determine (0)

Echogenic foci: none (0)

ACR TI-RADS total points: 4.

ACR TI-RADS risk category: TR4 (4-6 points).

ACR TI-RADS recommendations:

**Given size (>/= 1.5 cm) and appearance, fine needle aspiration of
this moderately suspicious nodule should be considered based on
TI-RADS criteria.

_________________________________________________________
IMPRESSION: 1. Poorly defined nodule (Nodule 2) along the inferior aspect of the
left thyroid lobe that was not clearly visualized on the previous
ultrasound. This likely represents an exophytic nodule measuring up
to 1.8 cm. This is a TR 4 nodule and meets criteria for
ultrasound-guided biopsy.
2. Previously biopsied left thyroid nodule (Nodule 1) has minimally
enlarged in size.

The above is in keeping with the ACR TI-RADS recommendations - [HOSPITAL] 2878;[DATE].

## 2022-01-13 ENCOUNTER — Other Ambulatory Visit: Payer: Self-pay | Admitting: Surgery

## 2022-01-13 DIAGNOSIS — E041 Nontoxic single thyroid nodule: Secondary | ICD-10-CM

## 2022-02-01 IMAGING — US US THYROID
1 series · 14 of 18 positions shown · non-contrast
Comparison: 09/27/2019, 10/02/2018, 08/28/2020

CLINICAL DATA: 45-year-old male referred with history prior left
thyroid nodule biopsy and most recent thyroid ultrasound
demonstrating a questionable left thyroid nodule new from baseline
study.

EXAM:
THYROID ULTRASOUND
TECHNIQUE: Ultrasound examination of the thyroid gland and adjacent soft
tissues was performed.

[Series 1: us thyroid · 0.07mm/px · 14 of 18 slices shown]
[im 1/18]
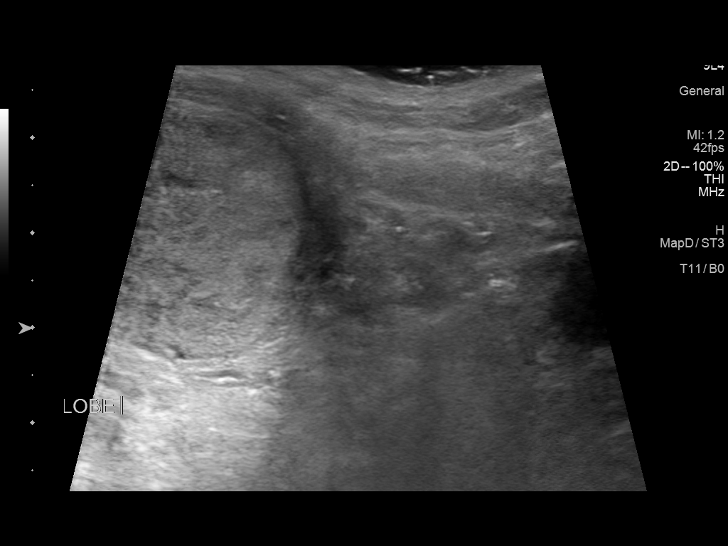
[im 2/18]
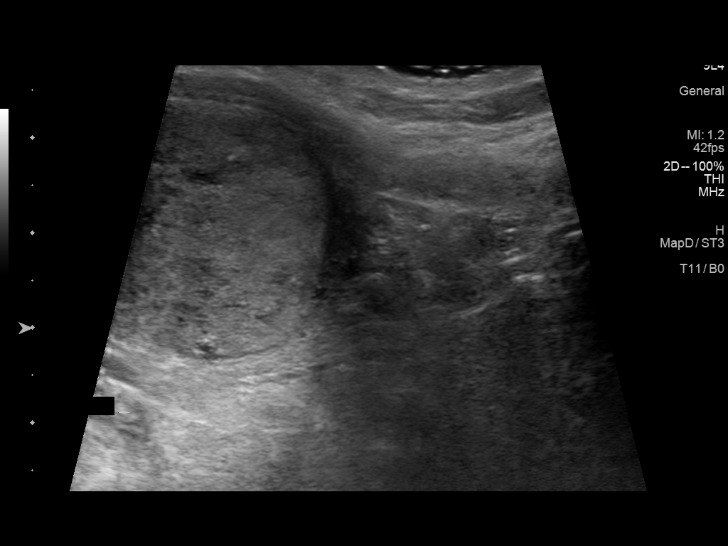
[im 4/18]
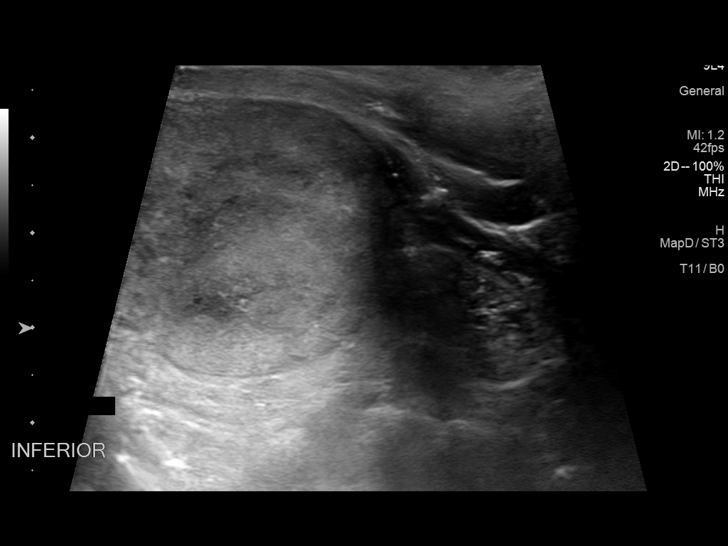
[im 5/18]
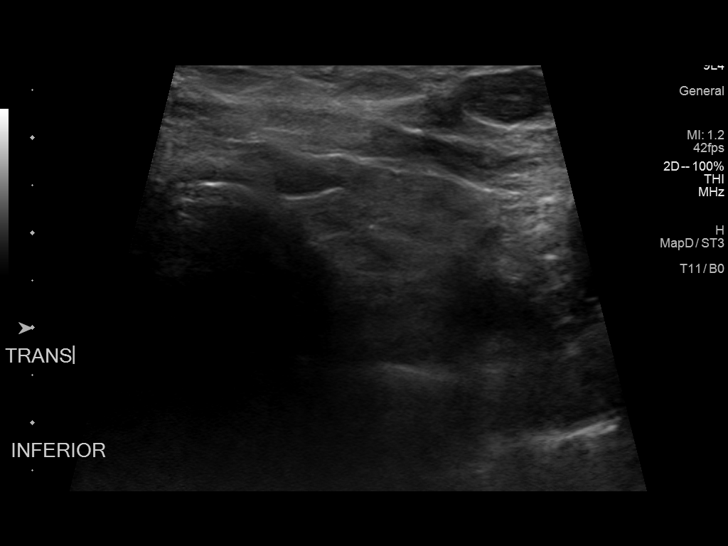
[im 6/18]
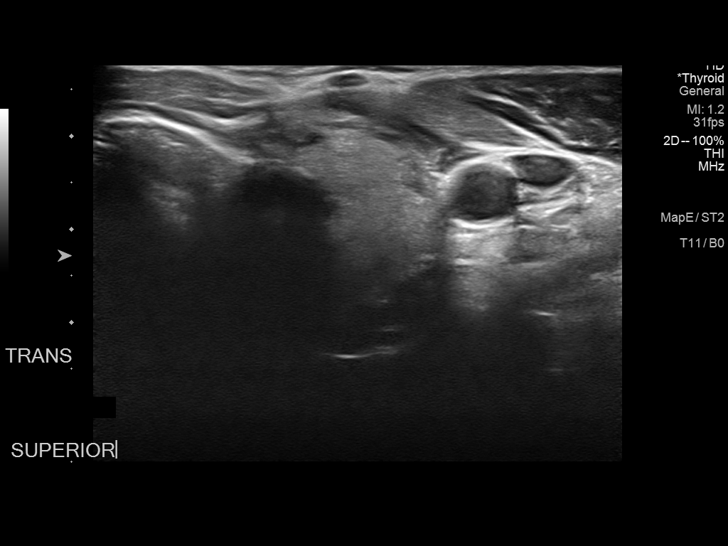
[im 8/18]
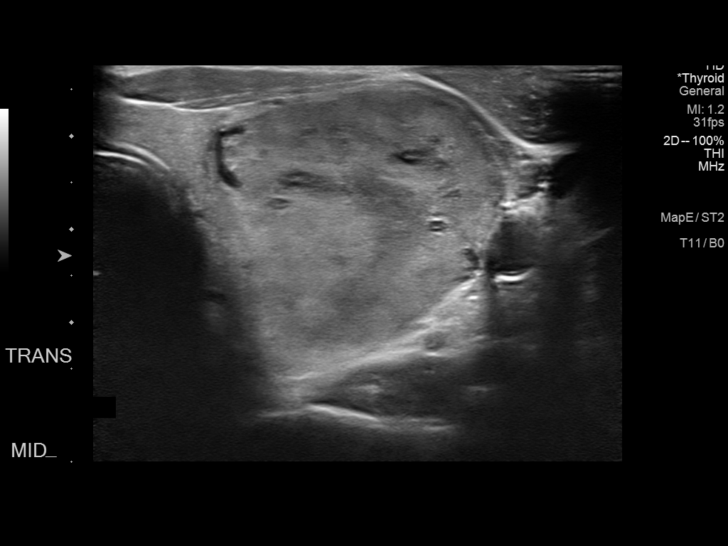
[im 9/18]
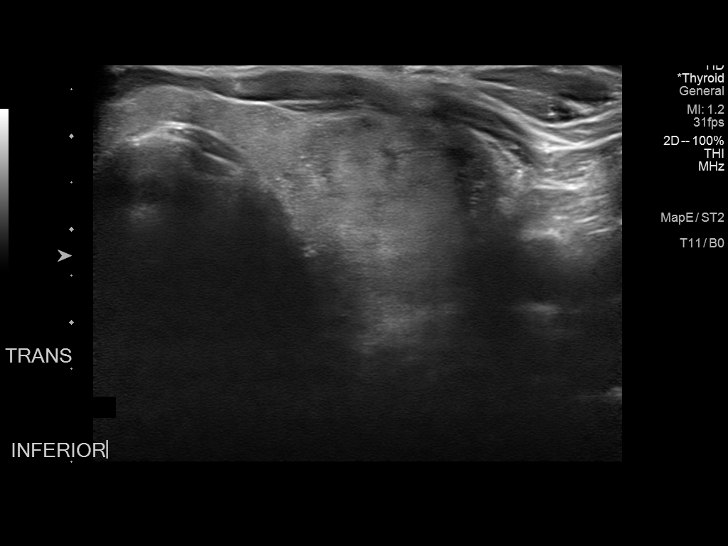
[im 10/18]
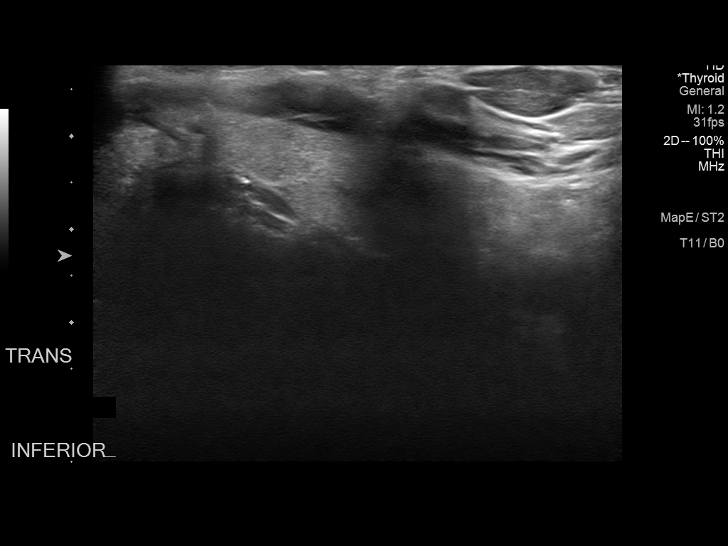
[im 11/18]
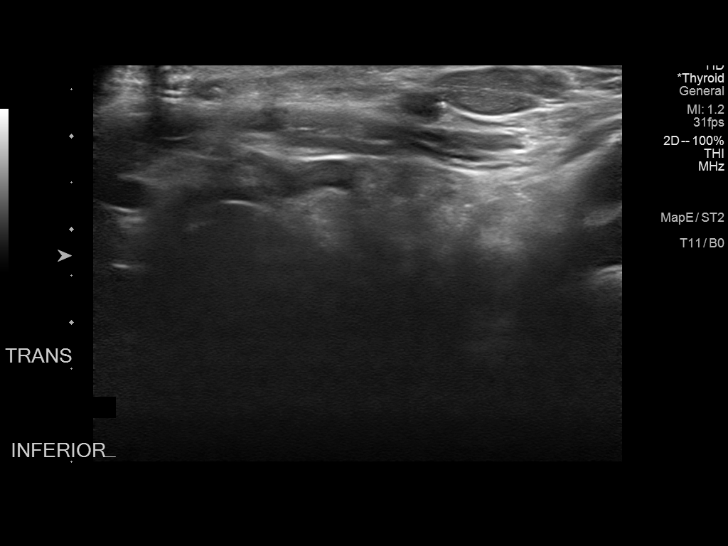
[im 13/18]
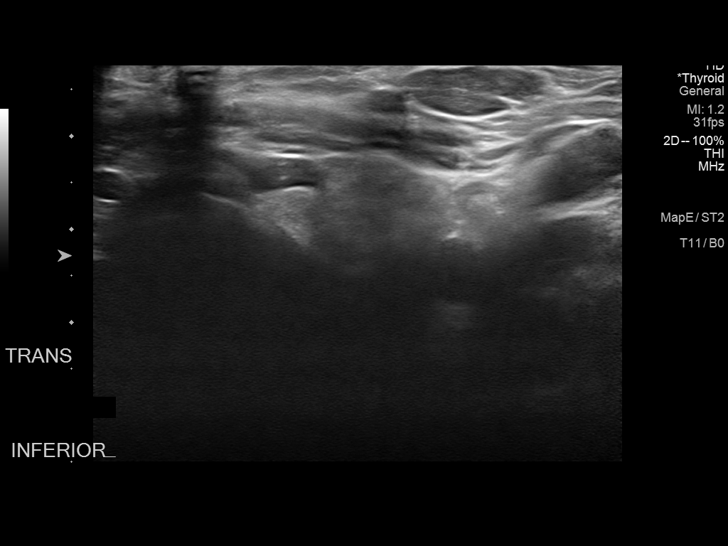
[im 14/18]
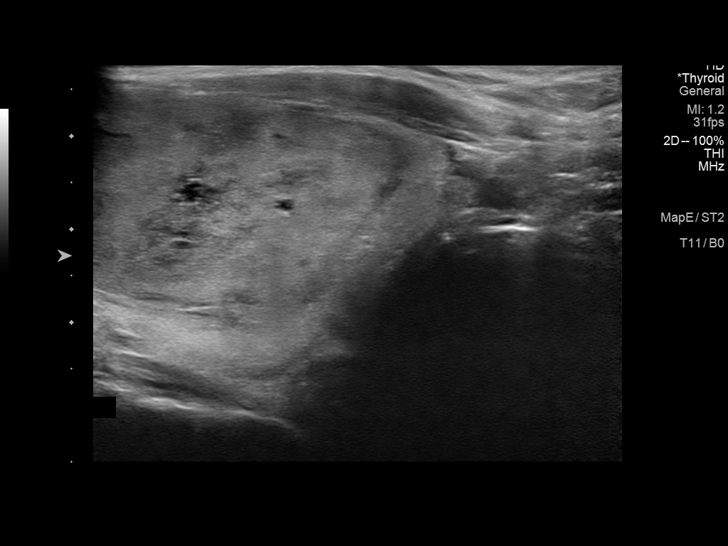
[im 15/18]
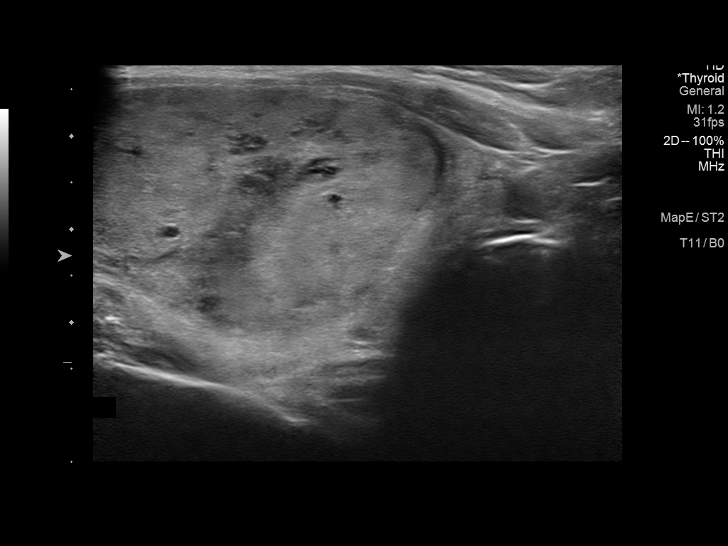
[im 17/18]
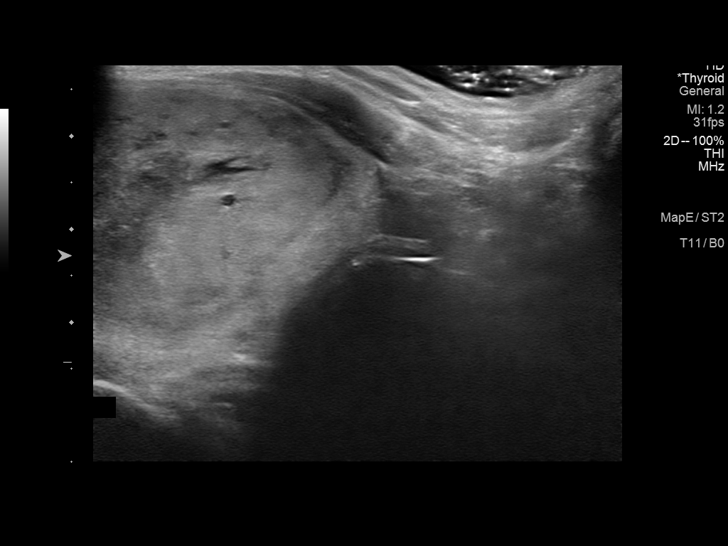
[im 18/18]
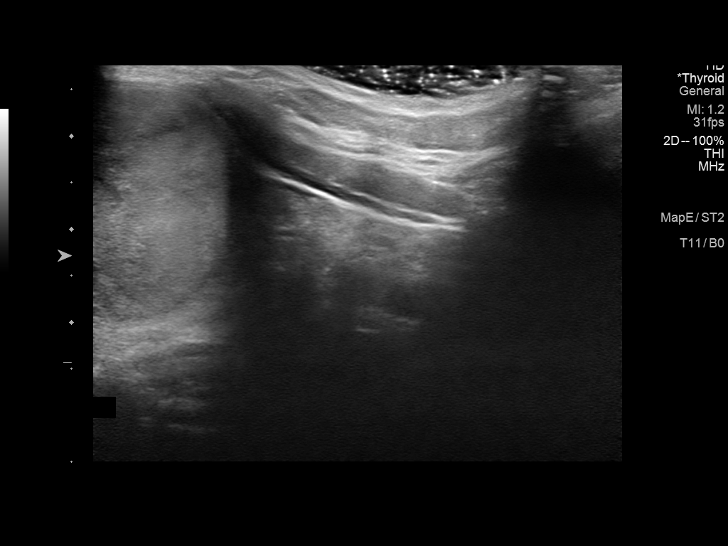

[14 of 18 positions shown; findings below may reference images not displayed]

FINDINGS: Ultrasound performed today for a post thyroid biopsy of left-sided
thyroid nodule.

There is no ultrasound correlate identified on today's study that
matches the questionable exophytic nodule identified on prior
ultrasound of 08/28/2020. No adenopathy.

Biopsy not performed on today's date.
IMPRESSION: Ultrasound performed anticipating a possible biopsy previous
identified left thyroid exophytic nodule identifies no correlate
that matches the finding on prior ultrasound. Biopsy was deferred.

Findings were discussed with the patient, including possibility
interval surveillance ultrasound imaging strategy.

## 2022-02-17 ENCOUNTER — Encounter: Payer: Self-pay | Admitting: Surgery

## 2022-02-18 ENCOUNTER — Ambulatory Visit
Admission: RE | Admit: 2022-02-18 | Discharge: 2022-02-18 | Disposition: A | Discharge status: Home / Self Care | Payer: BC Managed Care – PPO | Source: Ambulatory Visit | Attending: Surgery | Admitting: Surgery

## 2022-02-18 DIAGNOSIS — J3489 Other specified disorders of nose and nasal sinuses: Secondary | ICD-10-CM | POA: Diagnosis not present

## 2022-02-18 DIAGNOSIS — E041 Nontoxic single thyroid nodule: Secondary | ICD-10-CM

## 2022-02-18 DIAGNOSIS — E042 Nontoxic multinodular goiter: Secondary | ICD-10-CM | POA: Diagnosis not present

## 2022-02-18 MED ORDER — IOPAMIDOL (ISOVUE-300) INJECTION 61%
75.0000 mL | Freq: Once | INTRAVENOUS | Status: AC | PRN
  Administered 2022-02-18: 75 mL via INTRAVENOUS

## 2022-02-24 NOTE — Progress Notes (Signed)
CT shows an asymmetrically enlarged thyroid with chronic nodules.  No new or worrisome findings. Some tracheal deviation.  I would like to examine in the office for follow up.  Eric Chaney - please arrange follow up appt with me.  tmg  Armandina Gemma, [facility] [place] practice Office: [phone]

## 2022-03-12 DIAGNOSIS — E041 Nontoxic single thyroid nodule: Secondary | ICD-10-CM | POA: Diagnosis not present

## 2022-06-23 DIAGNOSIS — Z125 Encounter for screening for malignant neoplasm of prostate: Secondary | ICD-10-CM | POA: Diagnosis not present

## 2022-06-23 DIAGNOSIS — R7989 Other specified abnormal findings of blood chemistry: Secondary | ICD-10-CM | POA: Diagnosis not present

## 2022-06-23 DIAGNOSIS — R7301 Impaired fasting glucose: Secondary | ICD-10-CM | POA: Diagnosis not present

## 2022-06-23 DIAGNOSIS — R002 Palpitations: Secondary | ICD-10-CM | POA: Diagnosis not present

## 2022-06-23 DIAGNOSIS — E041 Nontoxic single thyroid nodule: Secondary | ICD-10-CM | POA: Diagnosis not present

## 2022-06-30 DIAGNOSIS — E041 Nontoxic single thyroid nodule: Secondary | ICD-10-CM | POA: Diagnosis not present

## 2022-06-30 DIAGNOSIS — Z0000 Encounter for general adult medical examination without abnormal findings: Secondary | ICD-10-CM | POA: Diagnosis not present

## 2022-06-30 DIAGNOSIS — R002 Palpitations: Secondary | ICD-10-CM | POA: Diagnosis not present

## 2022-06-30 DIAGNOSIS — Z1339 Encounter for screening examination for other mental health and behavioral disorders: Secondary | ICD-10-CM | POA: Diagnosis not present

## 2022-06-30 DIAGNOSIS — R82998 Other abnormal findings in urine: Secondary | ICD-10-CM | POA: Diagnosis not present

## 2022-06-30 DIAGNOSIS — G43909 Migraine, unspecified, not intractable, without status migrainosus: Secondary | ICD-10-CM | POA: Diagnosis not present

## 2022-06-30 DIAGNOSIS — Z1331 Encounter for screening for depression: Secondary | ICD-10-CM | POA: Diagnosis not present

## 2022-06-30 DIAGNOSIS — R7301 Impaired fasting glucose: Secondary | ICD-10-CM | POA: Diagnosis not present

## 2022-07-14 ENCOUNTER — Encounter: Payer: Self-pay | Admitting: Internal Medicine

## 2022-09-16 ENCOUNTER — Other Ambulatory Visit: Payer: Self-pay

## 2022-09-16 ENCOUNTER — Telehealth: Payer: Self-pay

## 2022-09-16 ENCOUNTER — Ambulatory Visit (AMBULATORY_SURGERY_CENTER): Payer: BC Managed Care – PPO

## 2022-09-16 VITALS — Ht 69.0 in | Wt 172.0 lb

## 2022-09-16 DIAGNOSIS — Z1211 Encounter for screening for malignant neoplasm of colon: Secondary | ICD-10-CM

## 2022-09-16 MED ORDER — NA SULFATE-K SULFATE-MG SULF 17.5-3.13-1.6 GM/177ML PO SOLN: 1.0000 | Freq: Once | ORAL | 0 refills | Status: AC

## 2022-09-16 NOTE — Progress Notes (Signed)
No egg or soy allergy known to patient  No issues known to pt with past sedation with any surgeries or procedures Patient denies ever being told they had issues or difficulty with intubation  No FH of Malignant Hyperthermia Pt is not on diet pills Pt is not on  home 02  Pt is not on blood thinners  Pt denies issues with constipation  No A fib or A flutter Have any cardiac testing pending--no Ambulates independently. Pt instructed to use [email or web address] or GoodRx for a price reduction on prep   

## 2022-09-16 NOTE — Telephone Encounter (Signed)
Dr. Rhea Belton,  I just completed this patient's pre-visit; he is scheduled for screening colonoscopy for 8/5. He expressed that he has been having increasing reflux issues for the last 6 months. He knows that he will need to reschedule for another day if he has a double procedure, but he also wondered if you would want to prescribe something for him to try first.  Please advise.  Thank you.

## 2022-09-20 NOTE — Telephone Encounter (Signed)
Inbound call from patient returning phone call. Requesting a call back. Please advise, thank you.  

## 2022-09-20 NOTE — Telephone Encounter (Signed)
Would advise he try pantoprazole 40 mg once daily x 4-8 weeks If symptoms of GERD persist then he should let me know and EGD would be considered JMP

## 2022-09-20 NOTE — Telephone Encounter (Signed)
Spoke with pt regarding issues with GERD. Read MD's advise as written by Dr Rhea Belton to take Pantoprazole 40 mg daily for 4-8 weeks and if GERD symptoms still prsist then he should contact MD and EDG would be considered.. Discussed canceling current colonoscopy and what he wanted to do. Pt agreed to have current colonoscopy canceled and try med mentioned and he will call and either reschedule Endo /Colon and or make OV with MD. Pt had no other questions or concerns at this time. Instructed him to call # and ask pre-visit nurse if has any. Did inform pt that pre-visit is fgood for 90 days.

## 2022-09-20 NOTE — Telephone Encounter (Signed)
Attempted to reach pt to give instructions given by Dr Rhea Belton for pt. Unable to reach. LM with call back #.

## 2022-09-21 ENCOUNTER — Telehealth: Payer: Self-pay | Admitting: *Deleted

## 2022-09-21 NOTE — Telephone Encounter (Signed)
Patient is calling back to speak with you , he said Dr.Pyrtle recommendations was to take pantoprazole but he would need a prescription for it .Please advise.

## 2022-09-21 NOTE — Telephone Encounter (Signed)
TE to Dr Russella Dar and MD RN [place] to request RX for pt. Called pt and LM that MD is aware of the need and RN will contact him as soon as a reply to TE is complete.

## 2022-09-21 NOTE — Telephone Encounter (Signed)
Dr Rhea Belton responded to TE and RX to be ordered. Pt called and Message left that RX should be in pharmacy soon.

## 2022-09-21 NOTE — Telephone Encounter (Signed)
T will contact the pt and inform him the RX will be sent in. Thank you for your help  in this matter.

## 2022-09-21 NOTE — Telephone Encounter (Signed)
Pt called back and made aware RX will be sent to pharmacy.

## 2022-09-22 ENCOUNTER — Other Ambulatory Visit: Payer: Self-pay

## 2022-09-22 MED ORDER — PANTOPRAZOLE SODIUM 40 MG PO TBEC: 40.0000 mg | DELAYED_RELEASE_TABLET | Freq: Every day | ORAL | 3 refills | Status: AC

## 2022-09-22 NOTE — Telephone Encounter (Signed)
Protonix prescription sent to pharmacy

## 2022-09-29 ENCOUNTER — Other Ambulatory Visit (HOSPITAL_COMMUNITY): Payer: Self-pay

## 2022-09-29 ENCOUNTER — Telehealth: Payer: Self-pay | Admitting: Pharmacy Technician

## 2022-09-29 NOTE — Telephone Encounter (Signed)
Pharmacy Patient Advocate Encounter   Received notification from CoverMyMeds that prior authorization for PANTOPRAZOLE 40MG  is required/requested.   Insurance verification completed.   The patient is insured through [facility] .   Per test claim: PA submitted to [employer] via CoverMyMeds Key/confirmation #/EOC [facility] Status is pending

## 2022-09-29 NOTE — Telephone Encounter (Signed)
Pharmacy Patient Advocate Encounter  Received notification from [facility] that Prior Authorization for PANTOPRAZOLE 40MG  has been APPROVED from 7.18.24 to 7.18.25. Ran test claim, Copay is $ALREADY BEING FILLED.  PA #/Case ID/Reference #: [id number]

## 2022-10-11 ENCOUNTER — Encounter: Payer: BC Managed Care – PPO | Admitting: Internal Medicine

## 2022-11-16 ENCOUNTER — Telehealth: Payer: Self-pay | Admitting: Internal Medicine

## 2022-11-16 NOTE — Telephone Encounter (Signed)
Patient scheduled for double procedure on 12/9 at 10:30. Advised patient procedure instructions will be sent via [facility]. Patient advised understanding.

## 2022-11-17 ENCOUNTER — Other Ambulatory Visit: Payer: Self-pay

## 2022-11-17 DIAGNOSIS — Z1211 Encounter for screening for malignant neoplasm of colon: Secondary | ICD-10-CM

## 2022-11-17 DIAGNOSIS — K219 Gastro-esophageal reflux disease without esophagitis: Secondary | ICD-10-CM

## 2022-11-17 MED ORDER — NA SULFATE-K SULFATE-MG SULF 17.5-3.13-1.6 GM/177ML PO SOLN: ORAL | 0 refills | Status: AC

## 2022-11-17 NOTE — Telephone Encounter (Signed)
Prep prescription sent to pharmacy, prep insructions sent via mychart. Amb ref entered in epic.

## 2023-01-25 ENCOUNTER — Encounter: Payer: Self-pay | Admitting: Internal Medicine

## 2023-02-13 ENCOUNTER — Encounter: Payer: Self-pay | Admitting: Certified Registered Nurse Anesthetist

## 2023-02-14 ENCOUNTER — Encounter: Payer: Self-pay | Admitting: Internal Medicine

## 2023-02-14 ENCOUNTER — Ambulatory Visit (AMBULATORY_SURGERY_CENTER): Payer: BC Managed Care – PPO | Admitting: Internal Medicine

## 2023-02-14 VITALS — BP 137/77 | HR 83 | Temp 98.8°F | Resp 13 | Ht 68.0 in | Wt 172.0 lb

## 2023-02-14 DIAGNOSIS — K2100 Gastro-esophageal reflux disease with esophagitis, without bleeding: Secondary | ICD-10-CM

## 2023-02-14 DIAGNOSIS — K2950 Unspecified chronic gastritis without bleeding: Secondary | ICD-10-CM | POA: Diagnosis not present

## 2023-02-14 DIAGNOSIS — K219 Gastro-esophageal reflux disease without esophagitis: Secondary | ICD-10-CM

## 2023-02-14 DIAGNOSIS — K635 Polyp of colon: Secondary | ICD-10-CM | POA: Diagnosis not present

## 2023-02-14 DIAGNOSIS — D123 Benign neoplasm of transverse colon: Secondary | ICD-10-CM

## 2023-02-14 DIAGNOSIS — D122 Benign neoplasm of ascending colon: Secondary | ICD-10-CM | POA: Diagnosis not present

## 2023-02-14 DIAGNOSIS — D128 Benign neoplasm of rectum: Secondary | ICD-10-CM | POA: Diagnosis not present

## 2023-02-14 DIAGNOSIS — K2970 Gastritis, unspecified, without bleeding: Secondary | ICD-10-CM

## 2023-02-14 DIAGNOSIS — Z1211 Encounter for screening for malignant neoplasm of colon: Secondary | ICD-10-CM | POA: Diagnosis not present

## 2023-02-14 MED ORDER — SODIUM CHLORIDE 0.9 % IV SOLN: 500.0000 mL | Freq: Once | INTRAVENOUS | Status: DC

## 2023-02-14 NOTE — Op Note (Signed)
[facility] Patient Name: Eric Chaney Procedure Date: 02/14/2023 10:18 AM MRN: [id number] Endoscopist: Beverley Fiedler , MD, [id number] Age: 47 Referring MD:  Date of Birth: 1975-10-19 Gender: Male Account #: [id number] Procedure:                Colonoscopy Indications:              Screening for colorectal malignant neoplasm, This                            is the patient's first colonoscopy Medicines:                Monitored Anesthesia Care Procedure:                Pre-Anesthesia Assessment:                           - Prior to the procedure, a History and Physical                            was performed, and patient medications and                            allergies were reviewed. The patient's tolerance of                            previous anesthesia was also reviewed. The risks                            and benefits of the procedure and the sedation                            options and risks were discussed with the patient.                            All questions were answered, and informed consent                            was obtained. Prior Anticoagulants: The patient has                            taken no anticoagulant or antiplatelet agents. ASA                            Grade Assessment: II - A patient with mild systemic                            disease. After reviewing the risks and benefits,                            the patient was deemed in satisfactory condition to                            undergo the procedure.  After obtaining informed consent, the colonoscope                            was passed under direct vision. Throughout the                            procedure, the patient's blood pressure, pulse, and                            oxygen saturations were monitored continuously. The                            Olympus Scope SN: [id number] was introduced through                            the anus and advanced to  the terminal ileum. The                            colonoscopy was performed without difficulty. The                            patient tolerated the procedure well. The quality                            of the bowel preparation was excellent. The                            terminal ileum, ileocecal valve, appendiceal                            orifice, and rectum were photographed. Scope In: 10:50:32 AM Scope Out: 11:12:33 AM Scope Withdrawal Time: 0 hours 20 minutes 20 seconds  Total Procedure Duration: 0 hours 22 minutes 1 second  Findings:                 The digital rectal exam was normal.                           The terminal ileum appeared normal.                           A 25 mm polyp was found in the proximal ascending                            colon. The polyp was sessile. The polyp was removed                            with a cold snare. Resection and retrieval were                            complete.                           Two sessile polyps were found in the ascending  colon. The polyps were 4 to 9 mm in size. These                            polyps were removed with a cold snare. Resection                            and retrieval were complete.                           A 12 mm polyp was found in the transverse colon.                            The polyp was sessile. The polyp was removed with a                            cold snare. Resection and retrieval were complete.                           A 4 mm polyp was found in the distal rectum. The                            polyp was sessile. The polyp was removed with a                            cold snare. Resection and retrieval were complete.                           No additional abnormalities were found on                            retroflexion. Complications:            No immediate complications. Estimated Blood Loss:     Estimated blood loss was minimal. Impression:                - The examined portion of the ileum was normal.                           - One 25 mm polyp in the proximal ascending colon,                            removed with a cold snare. Resected and retrieved.                           - Two 4 to 9 mm polyps in the ascending colon,                            removed with a cold snare. Resected and retrieved.                           - One 12 mm polyp in the transverse colon, removed  with a cold snare. Resected and retrieved.                           - One 4 mm polyp in the distal rectum, removed with                            a cold snare. Resected and retrieved. Recommendation:           - Patient has a contact number available for                            emergencies. The signs and symptoms of potential                            delayed complications were discussed with the                            patient. Return to normal activities tomorrow.                            Written discharge instructions were provided to the                            patient.                           - Resume previous diet.                           - Continue present medications.                           - Await pathology results.                           - Repeat colonoscopy is recommended for                            surveillance. The colonoscopy date will be                            determined after pathology results from today's                            exam become available for review. Beverley Fiedler, MD 02/14/2023 11:30:16 AM This report has been signed electronically.

## 2023-02-14 NOTE — Progress Notes (Signed)
Called to room to assist during endoscopic procedure.  Patient ID and intended procedure confirmed with present staff. Received instructions for my participation in the procedure from the performing physician.  

## 2023-02-14 NOTE — Progress Notes (Signed)
1050 HR > 100 with esmolol 25 mg given IV, MD updated, vss

## 2023-02-14 NOTE — Progress Notes (Signed)
Pt's states no medical or surgical changes since previsit or office visit. 

## 2023-02-14 NOTE — Patient Instructions (Signed)
Please read handouts provided. Continue present medications. Await pathology results. May consider resuming PPI after pathology results. In the meantime, Famotidine 20 mg twice daily as needed.   YOU HAD AN ENDOSCOPIC PROCEDURE TODAY AT THE [facility]:   Refer to the procedure report that was given to you for any specific questions about what was found during the examination.  If the procedure report does not answer your questions, please call your gastroenterologist to clarify.  If you requested that your care partner not be given the details of your procedure findings, then the procedure report has been included in a sealed envelope for you to review at your convenience later.  YOU SHOULD EXPECT: Some feelings of bloating in the abdomen. Passage of more gas than usual.  Walking can help get rid of the air that was put into your GI tract during the procedure and reduce the bloating. If you had a lower endoscopy (such as a colonoscopy or flexible sigmoidoscopy) you may notice spotting of blood in your stool or on the toilet paper. If you underwent a bowel prep for your procedure, you may not have a normal bowel movement for a few days.  Please Note:  You might notice some irritation and congestion in your nose or some drainage.  This is from the oxygen used during your procedure.  There is no need for concern and it should clear up in a day or so.  SYMPTOMS TO REPORT IMMEDIATELY:  Following lower endoscopy (colonoscopy or flexible sigmoidoscopy):  Excessive amounts of blood in the stool  Significant tenderness or worsening of abdominal pains  Swelling of the abdomen that is new, acute  Fever of 100F or higher  Following upper endoscopy (EGD)  Vomiting of blood or coffee ground material  New chest pain or pain under the shoulder blades  Painful or persistently difficult swallowing  New shortness of breath  Fever of 100F or higher  Black, tarry-looking stools  For urgent or  emergent issues, a gastroenterologist can be reached at any hour by calling [phone]. Do not use MyChart messaging for urgent concerns.    DIET:  We do recommend a small meal at first, but then you may proceed to your regular diet.  Drink plenty of fluids but you should avoid alcoholic beverages for 24 hours.  ACTIVITY:  You should plan to take it easy for the rest of today and you should NOT DRIVE or use heavy machinery until tomorrow (because of the sedation medicines used during the test).    FOLLOW UP: Our staff will call the number listed on your records the next business day following your procedure.  We will call around 7:15- 8:00 am to check on you and address any questions or concerns that you may have regarding the information given to you following your procedure. If we do not reach you, we will leave a message.     If any biopsies were taken you will be contacted by phone or by letter within the next 1-3 weeks.  Please call us at [phone] if you have not heard about the biopsies in 3 weeks.    SIGNATURES/CONFIDENTIALITY: You and/or your care partner have signed paperwork which will be entered into your electronic medical record.  These signatures attest to the fact that that the information above on your After Visit Summary has been reviewed and is understood.  Full responsibility of the confidentiality of this discharge information lies with you and/or your care-partner.

## 2023-02-14 NOTE — Progress Notes (Signed)
1040 HR > 100 with esmolol 25 mg given IV, MD updated, vss

## 2023-02-14 NOTE — Op Note (Signed)
[facility] Patient Name: Eric Chaney Procedure Date: 02/14/2023 10:29 AM MRN: [id number] Endoscopist: Beverley Fiedler , MD, [id number] Age: 47 Referring MD:  Date of Birth: Oct 08, 1975 Gender: Male Account #: [id number] Procedure:                Upper GI endoscopy Indications:              Suspected gastro-esophageal reflux disease,                            responsive to pantoprazole x 3 months, now off PPI                            x 4 weeks, symptoms of reflux improved but                            intermittent heartburn, no dysphagia Medicines:                Monitored Anesthesia Care Procedure:                Pre-Anesthesia Assessment:                           - Prior to the procedure, a History and Physical                            was performed, and patient medications and                            allergies were reviewed. The patient's tolerance of                            previous anesthesia was also reviewed. The risks                            and benefits of the procedure and the sedation                            options and risks were discussed with the patient.                            All questions were answered, and informed consent                            was obtained. Prior Anticoagulants: The patient has                            taken no anticoagulant or antiplatelet agents. ASA                            Grade Assessment: II - A patient with mild systemic                            disease. After reviewing the risks and benefits,  the patient was deemed in satisfactory condition to                            undergo the procedure.                           After obtaining informed consent, the endoscope was                            passed under direct vision. Throughout the                            procedure, the patient's blood pressure, pulse, and                            oxygen saturations were  monitored continuously. The                            Olympus Scope [id number] was introduced through the                            mouth, and advanced to the second part of duodenum.                            The upper GI endoscopy was accomplished without                            difficulty. The patient tolerated the procedure                            well. Scope In: Scope Out: Findings:                 LA Grade A (one or more mucosal breaks less than 5                            mm, not extending between tops of 2 mucosal folds)                            esophagitis was found in the lower third of the                            esophagus. There were mucosal changes consistent                            with reflux in the lower esophagus (edema, linear                            furrows and granularity). Multiple biopsies were                            obtained in the proximal esophagus and in the  distal esophagus with cold forceps for histology.                           Patchy mildly erythematous mucosa without bleeding                            was found in the gastric antrum. Biopsies were                            taken with a cold forceps for histology and                            Helicobacter pylori testing.                           The cardia and gastric fundus were normal on                            retroflexion.                           The examined duodenum was normal. Complications:            No immediate complications. Estimated Blood Loss:     Estimated blood loss was minimal. Impression:               - LA Grade A reflux esophagitis at GE junction and                            changes of acid reflux in the lower third of the                            esophagus.                           - Multiple biopsies were obtained in the proximal                            esophagus and in the distal esophagus.                            - Erythematous mucosa in the antrum. Biopsied to                            exclude H. Pylori.                           - Normal examined duodenum. Recommendation:           - Patient has a contact number available for                            emergencies. The signs and symptoms of potential                            delayed complications were discussed with the  patient. Return to normal activities tomorrow.                            Written discharge instructions were provided to the                            patient.                           - Resume previous diet.                           - Continue present medications.                           - Await pathology results. May consider resuming                            PPI given findings (after pathology results                            reviewed). In the meantime famotidine 20 mg can be                            used BID as needed. Beverley Fiedler, MD 02/14/2023 11:19:05 AM This report has been signed electronically.

## 2023-02-14 NOTE — Progress Notes (Signed)
1032 Robinul 0.1 mg IV given due large amount of secretions upon assessment.  MD made aware, vss

## 2023-02-14 NOTE — Progress Notes (Signed)
Report given to PACU, vss 

## 2023-02-14 NOTE — Progress Notes (Signed)
GASTROENTEROLOGY PROCEDURE H&P NOTE   Primary Care Physician: Cleatis Polka., MD    Reason for Procedure:   GERD and colon cancer screening  Plan:    EGD and colonoscopy  Patient is appropriate for endoscopic procedure(s) in the ambulatory ([facility]) setting.  The nature of the procedure, as well as the risks, benefits, and alternatives were carefully and thoroughly reviewed with the patient. Ample time for discussion and questions allowed. The patient understood, was satisfied, and agreed to proceed.     HPI: Eric Chaney is a 47 y.o. male who presents for EGD and colonoscopy.  Medical history as below.  Tolerated the prep.  No recent chest pain or shortness of breath.  No abdominal pain today.  Past Medical History:  Diagnosis Date   GERD (gastroesophageal reflux disease)    Migraine    Thyroid nodule     Past Surgical History:  Procedure Laterality Date   arm fracture Left 2003   no surgical history      Prior to Admission medications   Medication Sig Start Date End Date Taking? Authorizing Provider  Na Sulfate-K Sulfate-Mg Sulf 17.5-3.13-1.6 GM/177ML SOLN Take as directed Patient not taking: Reported on 02/14/2023 11/17/22   Fallan Mccarey, Carie Caddy, MD  pantoprazole (PROTONIX) 40 MG tablet Take 1 tablet (40 mg total) by mouth daily. 09/22/22   Neymar Dowe, Carie Caddy, MD  topiramate (TOPAMAX) 50 MG tablet TAKE 1 TABLET BY MOUTH AT BEDTIME 90 08/15/16  Yes [provider]  SUMAtriptan (IMITREX) 100 MG tablet 1 pill daily as needed for migraine Oral for 30 days Patient not taking: Reported on 02/14/2023    [provider]    Current Outpatient Medications  Medication Sig Dispense Refill   Na Sulfate-K Sulfate-Mg Sulf 17.5-3.13-1.6 GM/177ML SOLN Take as directed (Patient not taking: Reported on 02/14/2023) 354 mL 0   pantoprazole (PROTONIX) 40 MG tablet Take 1 tablet (40 mg total) by mouth daily. 90 tablet 3   topiramate (TOPAMAX) 50 MG tablet TAKE 1 TABLET BY MOUTH AT  BEDTIME 90     SUMAtriptan (IMITREX) 100 MG tablet 1 pill daily as needed for migraine Oral for 30 days (Patient not taking: Reported on 02/14/2023)     Current Facility-Administered Medications  Medication Dose Route Frequency Provider Last Rate Last Admin   0.9 %  sodium chloride infusion  500 mL Intravenous Once Coady Train, Carie Caddy, MD        Allergies as of 02/14/2023   (No Known Allergies)    Family History  Problem Relation Age of Onset   Hypertension Mother    Hypertension Brother    Colon cancer Maternal Uncle    Colon cancer Paternal Grandfather    Colon polyps Neg Hx    Esophageal cancer Neg Hx    Rectal cancer Neg Hx    Stomach cancer Neg Hx     Social History   Socioeconomic History   Marital status: Married    Spouse name: Not on file   Number of children: Not on file   Years of education: Not on file   Highest education level: Not on file  Occupational History   Not on file  Tobacco Use   Smoking status: Never   Smokeless tobacco: Never  Vaping Use   Vaping status: Never Used  Substance and Sexual Activity   Alcohol use: Yes    Comment: twice monthly   Drug use: Never   Sexual activity: Yes  Other Topics Concern  Not on file  Social History Narrative   Not on file   Social Determinants of Health   Financial Resource Strain: Not on file  Food Insecurity: Not on file  Transportation Needs: Not on file  Physical Activity: Not on file  Stress: Not on file  Social Connections: Not on file  Intimate Partner Violence: Not on file    Physical Exam: Vital signs in last 24 hours: @BP  (!) 112/90   Pulse (!) 113   Temp 98.8 F (37.1 C) (Skin)   Ht 5\' 8"  (1.727 m)   Wt 172 lb (78 kg)   SpO2 100%   BMI 26.15 kg/m  GEN: NAD EYE: Sclerae anicteric ENT: MMM CV: Non-tachycardic Pulm: CTA b/l GI: Soft, NT/ND NEURO:  Alert & Oriented x 3   Erick Blinks, MD [facility]  02/14/2023 10:25 AM

## 2023-02-15 ENCOUNTER — Telehealth: Payer: Self-pay

## 2023-02-15 NOTE — Telephone Encounter (Signed)
No answer, left message to call if having any issues or concerns, B.Lj Miyamoto RN 

## 2023-02-16 LAB — SURGICAL PATHOLOGY

## 2023-02-17 ENCOUNTER — Encounter: Payer: Self-pay | Admitting: Internal Medicine

## 2023-04-04 ENCOUNTER — Other Ambulatory Visit: Payer: Self-pay | Admitting: Surgery

## 2023-04-04 DIAGNOSIS — E049 Nontoxic goiter, unspecified: Secondary | ICD-10-CM

## 2023-04-04 DIAGNOSIS — E041 Nontoxic single thyroid nodule: Secondary | ICD-10-CM

## 2023-04-11 ENCOUNTER — Ambulatory Visit
Admission: RE | Admit: 2023-04-11 | Discharge: 2023-04-11 | Disposition: A | Discharge status: Home / Self Care | Payer: BC Managed Care – PPO | Source: Ambulatory Visit | Attending: Surgery | Admitting: Surgery

## 2023-04-11 DIAGNOSIS — E041 Nontoxic single thyroid nodule: Secondary | ICD-10-CM | POA: Diagnosis not present

## 2023-04-11 DIAGNOSIS — E049 Nontoxic goiter, unspecified: Secondary | ICD-10-CM

## 2023-07-05 DIAGNOSIS — E041 Nontoxic single thyroid nodule: Secondary | ICD-10-CM | POA: Diagnosis not present

## 2023-07-05 DIAGNOSIS — Z125 Encounter for screening for malignant neoplasm of prostate: Secondary | ICD-10-CM | POA: Diagnosis not present

## 2023-07-05 DIAGNOSIS — E049 Nontoxic goiter, unspecified: Secondary | ICD-10-CM | POA: Diagnosis not present

## 2023-07-05 DIAGNOSIS — R7301 Impaired fasting glucose: Secondary | ICD-10-CM | POA: Diagnosis not present

## 2023-07-07 ENCOUNTER — Other Ambulatory Visit: Payer: Self-pay | Admitting: Surgery

## 2023-07-07 DIAGNOSIS — E041 Nontoxic single thyroid nodule: Secondary | ICD-10-CM

## 2023-07-12 DIAGNOSIS — Z0000 Encounter for general adult medical examination without abnormal findings: Secondary | ICD-10-CM | POA: Diagnosis not present

## 2023-07-12 DIAGNOSIS — Z1339 Encounter for screening examination for other mental health and behavioral disorders: Secondary | ICD-10-CM | POA: Diagnosis not present

## 2023-07-12 DIAGNOSIS — R82998 Other abnormal findings in urine: Secondary | ICD-10-CM | POA: Diagnosis not present

## 2023-07-12 DIAGNOSIS — Z1331 Encounter for screening for depression: Secondary | ICD-10-CM | POA: Diagnosis not present

## 2023-07-12 DIAGNOSIS — E041 Nontoxic single thyroid nodule: Secondary | ICD-10-CM | POA: Diagnosis not present

## 2023-07-22 NOTE — Progress Notes (Signed)
 This encounter was conducted via the [employer] providing interactive audio and visual communication. The patient provided verbal consent to conduct a virtual appointment. The patient was located at their primary residence during this encounter.  Chief Complaint: Patient was seen in virtual video consultation today for symptomatic thyroid  nodule  Referring Physician(s): Gerkin,Todd  History of Present Illness: Eric Chaney is a 48 y.o. male with a medical history significant for a large left thyroid  nodule. Clinically this has remained stable to slightly enlarged over the past year and he has minimal compressive symptoms. He endorses occasional globus sensation. A thyroid  biopsy of this nodule was performed in 2021 with benign pathology. A repeat ultrasound February 2025 shows the nodule now measures 5.8 x 4.5 x 3.6 and is partially cystic. His thyroid  function is normal. He met with Dr. Sofia Dunn 07/05/23 to discuss his treatment options and they discussed thyroid  surgery or radiofrequency ablation. The patient is interested in learning more about radiofrequency ablation and he presents today via virtual video visit for further discussion.   Thyroid  Cosmetic Score:  Readily detected cosmetic problem (Grade 4).  Past Medical History:  Diagnosis Date   GERD (gastroesophageal reflux disease)    Migraine    Thyroid  nodule     Past Surgical History:  Procedure Laterality Date   arm fracture Left 2003   no surgical history      Allergies: Patient has no known allergies.  Medications: Prior to Admission medications   Medication Sig Start Date End Date Taking? Authorizing Provider  Na Sulfate-K Sulfate-Mg Sulf 17.5-3.13-1.6 GM/177ML SOLN Take as directed Patient not taking: Reported on 02/14/2023 11/17/22   Pyrtle, Amber Bail, MD  pantoprazole  (PROTONIX ) 40 MG tablet Take 1 tablet (40 mg total) by mouth daily. 09/22/22   Pyrtle, Amber Bail, MD  SUMAtriptan (IMITREX) 100 MG tablet 1  pill daily as needed for migraine Oral for 30 days Patient not taking: Reported on 02/14/2023    [provider]  topiramate (TOPAMAX) 50 MG tablet TAKE 1 TABLET BY MOUTH AT BEDTIME 90 08/15/16   [provider]     Family History  Problem Relation Age of Onset   Hypertension Mother    Hypertension Brother    Colon cancer Maternal Uncle    Colon cancer Paternal Grandfather    Colon polyps Neg Hx    Esophageal cancer Neg Hx    Rectal cancer Neg Hx    Stomach cancer Neg Hx     Social History   Socioeconomic History   Marital status: Married    Spouse name: Not on file   Number of children: Not on file   Years of education: Not on file   Highest education level: Not on file  Occupational History   Not on file  Tobacco Use   Smoking status: Never   Smokeless tobacco: Never  Vaping Use   Vaping status: Never Used  Substance and Sexual Activity   Alcohol  use: Yes    Comment: twice monthly   Drug use: Never   Sexual activity: Yes  Other Topics Concern   Not on file  Social History Narrative   Not on file   Social Drivers of Health   Financial Resource Strain: Not on file  Food Insecurity: Not on file  Transportation Needs: Not on file  Physical Activity: Not on file  Stress: Not on file  Social Connections: Not on file     Review of Systems: A 12 point ROS discussed and  pertinent positives are indicated in the HPI above.  All other systems are negative.  Vital Signs: There were no vitals taken for this visit.  Physical Exam  Patient is alert, oriented and able to participate fully in the conversation. No apparent discomfort or distress observed. He appears appropriately dressed.    Imaging:  US  Thyroid  04/11/23    Nodule #1, left thyroid . 5.8 x 4.5 x 3.6 cm   Labs:  CBC 07/05/23 WBC 6.3 RBC 5.0 HGB 14.7 PLT 270  TFTs 07/05/23 Serum TSH 1.750 Serum free T4 1.15  Serum T3 n/a Thyroperoxidase Antibody n/a Thyroglobulin Antibody  n/a Calcitonin n/a   Prior Thyroid  FNA 09/26/19 #1 Left thyroid   - [facility]   Assessment and Plan: 48 year old male with a history of a benign, symptomatic left thyroid  nodule.  He is hesitant to undergo surgery due to recovery time, risks, and potential for thyroid  hormone supplementation.  He is an excellent candidate for ultrasound guided radiofrequency ablation.  We discussed risks and benefits of radiofrequency thyroid  ablation to most commonly include discomfort/pain (2.6%), followed by less commonly (all 1.0% or less) voice change, nodule rupture, infection, hypothyroidism, brachial plexus injury, hematoma, vomiting, and skin burn.   He is amenable and wishes to proceed.  Plan for left thyroid  nodule RFA at [facility] with local anesthesia.  10 mm electrode.  Creasie Doctor, MD Pager: [phone]    I spent a total of  40 Minutes  in virtual video clinical consultation, greater than 50% of which was counseling/coordinating care for symptomatic thyroid  nodule.

## 2023-07-25 ENCOUNTER — Ambulatory Visit
Admission: RE | Admit: 2023-07-25 | Discharge: 2023-07-25 | Disposition: A | Discharge status: Home / Self Care | Source: Ambulatory Visit | Attending: Surgery | Admitting: Surgery

## 2023-07-25 DIAGNOSIS — E041 Nontoxic single thyroid nodule: Secondary | ICD-10-CM

## 2023-09-15 DIAGNOSIS — M25561 Pain in right knee: Secondary | ICD-10-CM | POA: Diagnosis not present

## 2023-10-10 DIAGNOSIS — D485 Neoplasm of uncertain behavior of skin: Secondary | ICD-10-CM | POA: Diagnosis not present

## 2023-10-10 DIAGNOSIS — D225 Melanocytic nevi of trunk: Secondary | ICD-10-CM | POA: Diagnosis not present

## 2023-10-10 DIAGNOSIS — L814 Other melanin hyperpigmentation: Secondary | ICD-10-CM | POA: Diagnosis not present

## 2023-10-10 DIAGNOSIS — D2272 Melanocytic nevi of left lower limb, including hip: Secondary | ICD-10-CM | POA: Diagnosis not present

## 2023-10-10 DIAGNOSIS — D2271 Melanocytic nevi of right lower limb, including hip: Secondary | ICD-10-CM | POA: Diagnosis not present

## 2023-10-10 DIAGNOSIS — L905 Scar conditions and fibrosis of skin: Secondary | ICD-10-CM | POA: Diagnosis not present

## 2023-11-28 ENCOUNTER — Other Ambulatory Visit: Payer: Self-pay | Admitting: Interventional Radiology

## 2023-11-28 DIAGNOSIS — E041 Nontoxic single thyroid nodule: Secondary | ICD-10-CM

## 2023-12-16 ENCOUNTER — Ambulatory Visit
Admission: RE | Admit: 2023-12-16 | Discharge: 2023-12-16 | Disposition: A | Discharge status: Home / Self Care | Source: Ambulatory Visit | Attending: Interventional Radiology | Admitting: Interventional Radiology

## 2023-12-16 DIAGNOSIS — E041 Nontoxic single thyroid nodule: Secondary | ICD-10-CM

## 2024-03-30 ENCOUNTER — Other Ambulatory Visit: Payer: Self-pay | Admitting: Interventional Radiology

## 2024-03-30 DIAGNOSIS — E041 Nontoxic single thyroid nodule: Secondary | ICD-10-CM

## 2024-04-05 ENCOUNTER — Ambulatory Visit
Admission: RE | Admit: 2024-04-05 | Discharge: 2024-04-05 | Disposition: A | Discharge status: Home / Self Care | Source: Ambulatory Visit | Attending: Interventional Radiology | Admitting: Interventional Radiology

## 2024-04-05 DIAGNOSIS — E041 Nontoxic single thyroid nodule: Secondary | ICD-10-CM

## 2024-04-08 NOTE — Progress Notes (Signed)
 "    This encounter was conducted via the [employer] providing interactive audio and visual communication.  The patient provided verbal consent to conduct a virtual appointment.  The patient was located at their primary residence during this encounter.  Referring Physician(s): Gerkin,Todd   Chief Complaint: The patient is seen in virtual video follow up today s/p left thyroid  nodule radiofrequency ablation 12/17/23  History of present illness: HPI from initial consultation 07/25/23 Eric Chaney is a 49 y.o. male with a medical history significant for a large left thyroid  nodule. Clinically this has remained stable to slightly enlarged over the past year and he has minimal compressive symptoms. He endorses occasional globus sensation. A thyroid  biopsy of this nodule was performed in 2021 with benign pathology. A repeat ultrasound February 2025 shows the nodule now measures 5.8 x 4.5 x 3.6 and is partially cystic. His thyroid  function is normal. He met with Dr. Eletha 07/05/23 to discuss his treatment options and they discussed thyroid  surgery or radiofrequency ablation. The patient is interested in learning more about radiofrequency ablation and he presents today via virtual video visit for further discussion.    Thyroid  Cosmetic Score:  Readily detected cosmetic problem (Grade 4).  He was hesitant to undergo surgery due to recovery time, risks, and potential for thyroid  hormone supplementation. He was considered an excellent candidate for radiofrequency ablation and we discussed the risks, benefits, alternatives and procedural expectations. On 12/17/23 he underwent a technically successful radiofrequency ablation of the left thyroid  nodule. He tolerated the procedure well and a surveillance ultrasound was obtained 04/05/24. He presents to the clinic today via virtual video visit for follow up. He reports significant improvement in the protrusion in his neck as well as his globus sensation.   He can still palpate the nodule, but it is much less noticeable.  He has not experienced and new hypo or hyperthyroid symptoms.  No pain.  Past Medical History:  Diagnosis Date   GERD (gastroesophageal reflux disease)    Migraine    Thyroid  nodule     Past Surgical History:  Procedure Laterality Date   arm fracture Left 2003   IR RADIOLOGIST EVAL & MGMT  07/25/2023   no surgical history      Allergies: Patient has no known allergies.  Medications: Prior to Admission medications  Medication Sig Start Date End Date Taking? Authorizing Provider  Na Sulfate-K Sulfate-Mg Sulf 17.5-3.13-1.6 GM/177ML SOLN Take as directed Patient not taking: Reported on 02/14/2023 11/17/22   Pyrtle, Gordy HERO, MD  pantoprazole  (PROTONIX ) 40 MG tablet Take 1 tablet (40 mg total) by mouth daily. 09/22/22   Pyrtle, Gordy HERO, MD  SUMAtriptan (IMITREX) 100 MG tablet 1 pill daily as needed for migraine Oral for 30 days Patient not taking: Reported on 02/14/2023    [provider]  topiramate (TOPAMAX) 50 MG tablet TAKE 1 TABLET BY MOUTH AT BEDTIME 90 08/15/16   [provider]     Family History  Problem Relation Age of Onset   Hypertension Mother    Hypertension Brother    Colon cancer Maternal Uncle    Colon cancer Paternal Grandfather    Colon polyps Neg Hx    Esophageal cancer Neg Hx    Rectal cancer Neg Hx    Stomach cancer Neg Hx     Social History   Socioeconomic History   Marital status: Married    Spouse name: Not on file   Number of children: Not on file   Years of  education: Not on file   Highest education level: Not on file  Occupational History   Not on file  Tobacco Use   Smoking status: Never   Smokeless tobacco: Never  Vaping Use   Vaping status: Never Used  Substance and Sexual Activity   Alcohol  use: Yes    Comment: twice monthly   Drug use: Never   Sexual activity: Yes  Other Topics Concern   Not on file  Social History Narrative   Not on file   Social  Drivers of Health   Tobacco Use: Low Risk (07/25/2023)   Patient History    Smoking Tobacco Use: Never    Smokeless Tobacco Use: Never    Passive Exposure: Not on file  Financial Resource Strain: Not on file  Food Insecurity: Not on file  Transportation Needs: Not on file  Physical Activity: Not on file  Stress: Not on file  Social Connections: Not on file  Depression (EYV7-0): Not on file  Alcohol  Screen: Not on file  Housing: Unknown (07/05/2023)   Received from [facility]   Epic    Unable to Pay for Housing in the Last Year: Not on file    Number of Times Moved in the Last Year: Not on file    At any time in the past 12 months, were you homeless or living in a shelter (including now)?: No  Utilities: Not on file  Health Literacy: Not on file     Vital Signs: There were no vitals taken for this visit.  Physical Exam  Patient is alert, oriented and able to participate fully in the conversation. No apparent discomfort or distress observed. He appears appropriately dressed.    Imaging: US  Thyroid  04/11/23      Nodule #1, left thyroid . 5.8 x 4.5 x 3.6 cm   US  Thyroid  04/05/24  5.2 x 3.4 x 3.0 cm = ~45% volume reduction   Labs:   CBC 07/05/23 WBC 6.3 RBC 5.0 HGB 14.7 PLT 270   TFTs 07/05/23 Serum TSH 1.750 Serum free T4 1.15   Serum T3 n/a Thyroperoxidase Antibody n/a Thyroglobulin Antibody n/a Calcitonin n/a     Prior Thyroid  FNA 09/26/19 #1 Left thyroid   - [facility]    Assessment and Plan: 49 year old male with a history of a benign, symptomatic left thyroid  nodule. He was hesitant to undergo surgery due to recovery time, risks, and potential for thyroid  hormone supplementation. He underwent a technically successful radiofrequency ablation of the left thyroid  nodule 12/17/23.  The nodule demonstrates approximately 45% volume reduction since treatment.  He reports concordant improvement in his bulk symptoms.  Plan for US  thyroid  and  clinic follow up in October 2026.  Ester Sides, MD Pager: [phone]    I spent a total of 25 Minutes in virtual video clinical consultation, greater than 50% of which was counseling/coordinating care for symptomatic thyroid  nodule.      "

## 2024-04-09 ENCOUNTER — Inpatient Hospital Stay
Admission: RE | Admit: 2024-04-09 | Discharge: 2024-04-09 | Disposition: A | Source: Ambulatory Visit | Attending: Interventional Radiology

## 2024-04-09 DIAGNOSIS — E041 Nontoxic single thyroid nodule: Secondary | ICD-10-CM
